# Patient Record
Sex: Female | Born: 1990 | Race: White | Hispanic: No | Marital: Married | Smoking: Never smoker
Health system: Southern US, Community
[De-identification: ages and names within clinical notes are randomized; demographics above are authoritative.]

## PROBLEM LIST (undated history)

## (undated) DIAGNOSIS — R87629 Unspecified abnormal cytological findings in specimens from vagina: Secondary | ICD-10-CM

## (undated) DIAGNOSIS — F909 Attention-deficit hyperactivity disorder, unspecified type: Secondary | ICD-10-CM

## (undated) DIAGNOSIS — D6851 Activated protein C resistance: Secondary | ICD-10-CM

## (undated) DIAGNOSIS — Z9889 Other specified postprocedural states: Secondary | ICD-10-CM

## (undated) DIAGNOSIS — F419 Anxiety disorder, unspecified: Secondary | ICD-10-CM

## (undated) DIAGNOSIS — M6530 Trigger finger, unspecified finger: Secondary | ICD-10-CM

## (undated) HISTORY — DX: Attention-deficit hyperactivity disorder, unspecified type: F90.9

## (undated) HISTORY — DX: Anxiety disorder, unspecified: F41.9

## (undated) HISTORY — DX: Trigger finger, unspecified finger: M65.30

## (undated) HISTORY — DX: Activated protein C resistance: D68.51

## (undated) HISTORY — DX: Unspecified abnormal cytological findings in specimens from vagina: R87.629

## (undated) HISTORY — PX: LEEP: SHX91

## (undated) HISTORY — DX: Other specified postprocedural states: Z98.890

---

## 2020-01-22 NOTE — L&D Delivery Note (Addendum)
       Delivery Note   Teresa Suarez is a 30 y.o. G3P2002 at [redacted]w[redacted]d Estimated Date of Delivery: 10/31/20  PRE-OPERATIVE DIAGNOSIS:  1) [redacted]w[redacted]d pregnancy.  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    2) Nuchal cord: Yes X1  SPECIMENS:   PLACENTA:   Appearance: Intact    Removal: Spontaneous      Disposition:    DISPOSITION:  Infant to left in stable condition in the delivery room, with L&D personnel and mother,  NARRATIVE SUMMARY: Labor course:  [name] is a [id number] at [redacted]w[redacted]d who presented for labor management.  She progressed well in labor without pitocin.  She received the appropriate anesthesia and proceeded to complete dilation.  AROM - clear fluid. 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 She evidenced good maternal expulsive effort during the second stage. She went on to deliver a viable infant. The placenta delivered without problems and was noted to be complete. A perineal and vaginal examination was performed. Episiotomy/Lacerations: None  Pt has refused lovenox - will accept compression stockings and SCDs and [id number]  Elonda Husky, M.D. 11/01/2020 4:58 PM

## 2020-04-11 LAB — OB RESULTS CONSOLE PLATELET COUNT: Platelets: 263

## 2020-04-11 LAB — OB RESULTS CONSOLE GC/CHLAMYDIA
Chlamydia: NEGATIVE
Gonorrhea: NEGATIVE

## 2020-04-11 LAB — OB RESULTS CONSOLE ABO/RH: RH Type: POSITIVE

## 2020-04-11 LAB — OB RESULTS CONSOLE HEPATITIS B SURFACE ANTIGEN
Hepatitis B Surface Ag: NEGATIVE
Hepatitis B Surface Ag: NEGATIVE

## 2020-04-11 LAB — OB RESULTS CONSOLE HGB/HCT, BLOOD
HCT: 43 — AB (ref 29–41)
Hemoglobin: 14.5

## 2020-04-11 LAB — OB RESULTS CONSOLE HIV ANTIBODY (ROUTINE TESTING): HIV: NONREACTIVE

## 2020-04-11 LAB — OB RESULTS CONSOLE ANTIBODY SCREEN: Antibody Screen: NEGATIVE

## 2020-08-08 ENCOUNTER — Other Ambulatory Visit

## 2020-08-08 ENCOUNTER — Other Ambulatory Visit: Payer: Self-pay

## 2020-08-08 ENCOUNTER — Ambulatory Visit (INDEPENDENT_AMBULATORY_CARE_PROVIDER_SITE_OTHER): Admitting: Obstetrics and Gynecology

## 2020-08-08 ENCOUNTER — Encounter: Payer: Self-pay | Admitting: Obstetrics and Gynecology

## 2020-08-08 VITALS — BP 124/79 | HR 80 | Ht 63.0 in | Wt 170.9 lb

## 2020-08-08 DIAGNOSIS — O99113 Other diseases of the blood and blood-forming organs and certain disorders involving the immune mechanism complicating pregnancy, third trimester: Secondary | ICD-10-CM | POA: Insufficient documentation

## 2020-08-08 DIAGNOSIS — Z3A28 28 weeks gestation of pregnancy: Secondary | ICD-10-CM

## 2020-08-08 DIAGNOSIS — Z3483 Encounter for supervision of other normal pregnancy, third trimester: Secondary | ICD-10-CM

## 2020-08-08 DIAGNOSIS — Z23 Encounter for immunization: Secondary | ICD-10-CM

## 2020-08-08 DIAGNOSIS — D6851 Activated protein C resistance: Secondary | ICD-10-CM

## 2020-08-08 DIAGNOSIS — O99213 Obesity complicating pregnancy, third trimester: Secondary | ICD-10-CM

## 2020-08-08 LAB — POCT URINALYSIS DIPSTICK OB
Bilirubin, UA: NEGATIVE
Blood, UA: NEGATIVE
Glucose, UA: NEGATIVE
Ketones, UA: NEGATIVE
Leukocytes, UA: NEGATIVE
Nitrite, UA: NEGATIVE
POC,PROTEIN,UA: NEGATIVE
Spec Grav, UA: 1.01 (ref 1.010–1.025)
Urobilinogen, UA: 0.2 E.U./dL
pH, UA: 7 (ref 5.0–8.0)

## 2020-08-08 MED ORDER — TETANUS-DIPHTH-ACELL PERTUSSIS 5-2.5-18.5 LF-MCG/0.5 IM SUSY
0.5000 mL | PREFILLED_SYRINGE | Freq: Once | INTRAMUSCULAR | Status: AC
  Administered 2020-08-08: 0.5 mL via INTRAMUSCULAR

## 2020-08-08 NOTE — Progress Notes (Signed)
NOB-Transfer-Pt stated that she was having hip pains maybe due to sleeping on an air mattress since waiting on house to be built. BTC, 1 hour glucose and tdpa completed. Hx of abnormal pap. Hx of LEEP and colpo. 1 episode of feeling like she was going to pass out. Pt husband needs a letter to be able to drive and be able to leave when needed.  Pt would like to breastfeed, epidural and husband planning to have vasectomy.

## 2020-08-08 NOTE — Progress Notes (Signed)
TRANSFER IN OB HISTORY AND PHYSICAL  SUBJECTIVE:       Teresa Suarez is a 30 y.o. G80P2002 female, Patient's last menstrual period was 01/25/2020., Estimated Date of Delivery: 10/31/20, [redacted]w[redacted]d, presents today for Transition of Prenatal Care.EPIC data migration from outside records is accomplished today. Husband is in the [employer], relocating from [place], [place].  Her pregnancy is significant for Factor V Leiden mutation (heterozygous).    Complaints today include:  Reports some hip pain. Thinks it is from sleeping on an air matress as they are in the process of moving.  Reports a near syncopal episode while grocery shopping ~ 2 weeks ago. Has never had this happen before. Was able to catch herself before actually passing out.     Gynecologic History Patient's last menstrual period was 01/25/2020. Normal Contraception: none Last Pap: ~ 1-2 years ago. Results were: normal. Remote h/o LEEP.    Obstetric History OB History  Gravida Para Term Preterm AB Living  3 2 2     2   SAB IAB Ectopic Multiple Live Births          2    # Outcome Date GA Lbr Len/2nd Weight Sex Delivery Anes PTL Lv  3 Current           2 Term 09/26/16    M Vag-Spont   LIV  1 Term 07/12/14    F Vag-Spont   LIV    Past Medical History:  Diagnosis Date   ADHD    Anxiety    Factor V Leiden (HCC)    Hetero-no vte hx expectant management; PP lovenox   History of colposcopy    Vaginal Pap smear, abnormal     Past Surgical History:  Procedure Laterality Date   LEEP N/A     Current Outpatient Medications on File Prior to Visit  Medication Sig Dispense Refill   Prenatal Vit-Fe Fumarate-FA (PRENATAL VITAMIN PO) Take by mouth.     No current facility-administered medications on file prior to visit.    Allergies  Allergen Reactions   Other Itching and Rash   Penicillins Rash    Rash as a child, itching as child     Social History   Socioeconomic History   Marital status: Married    Spouse name: Not on  file   Number of children: Not on file   Years of education: Not on file   Highest education level: Not on file  Occupational History   Not on file  Tobacco Use   Smoking status: Never   Smokeless tobacco: Never  Vaping Use   Vaping Use: Never used  Substance and Sexual Activity   Alcohol use: Never   Drug use: Never   Sexual activity: Yes  Other Topics Concern   Not on file  Social History Narrative   Not on file   Social Determinants of Health   Financial Resource Strain: Not on file  Food Insecurity: Not on file  Transportation Needs: Not on file  Physical Activity: Not on file  Stress: Not on file  Social Connections: Not on file  Intimate Partner Violence: Not on file    Family History  Problem Relation Age of Onset   Asthma Mother    Healthy Father    Factor V Leiden deficiency Paternal Grandmother     The following portions of the patient's history were reviewed and updated as appropriate: allergies, current medications, past OB history, past medical history, past surgical history, past family history, past social  history, and problem list.    OBJECTIVE: Initial Physical Exam (New OB) Blood pressure 124/79, pulse 80, height 5\' 3"  (1.6 m), weight 170 lb 14.4 oz (77.5 kg), last menstrual period 01/25/2020. Body mass index is 30.27 kg/m.   GENERAL APPEARANCE: alert, well appearing, mild obesity HEAD: normocephalic, atraumatic THYROID: no thyromegaly or masses present BREASTS: not examined LUNGS: clear to auscultation, no wheezes, rales or rhonchi, symmetric air entry HEART: regular rate and rhythm, no murmurs ABDOMEN: soft, nontender, nondistended, no abnormal masses, no epigastric pain.  FHT 140 bpm.  PELVIC EXAM deferred.  EXTREMITIES: no redness or tenderness in the calves or thighs SKIN: normal coloration and turgor, no rashes NEUROLOGIC: alert, oriented, normal speech, no focal findings or movement disorder noted    ASSESSMENT: Normal  pregnancy Factor V Leiden Near syncope Mild obesity of pregnancy  PLAN: Prenatal care - Discussed overview of the practice. Has had normal genetic testing. Discussed recommendations of use of Lovenox postpartum (heterozygosity is low risk of thrombophilia, so not currently on prophylaxis in pregnancy). Patient desires to think about this. Desires to breastfeed, considering vasectomy for contraception. Plans for an epidural in labor.  See orders - Completing 28 week labs today.  Tdap and blood consent signed.  Reviewed warning signs of syncope, advised to remain hydrated, rest when needed. Given reassurance that this can occur in pregnancy.  Patient desires letter for her husband to allow him to have a car on his campus due to her being pregnant.  Letter provided.  RTC in 2 weeks.    03/24/2020, MD [facility]

## 2020-08-08 NOTE — Patient Instructions (Addendum)
Common Medications Safe in Pregnancy  Acne:      Constipation:  Benzoyl Peroxide     Colace  Clindamycin      Dulcolax Suppository  Topica Erythromycin     Fibercon  Salicylic Acid      Metamucil         Miralax AVOID:        Senakot   Accutane    Cough:  Retin-A       Cough Drops  Tetracycline      Phenergan w/ Codeine if Rx  Minocycline      Robitussin (Plain & DM)  Antibiotics:     Crabs/Lice:  Ceclor       RID  Cephalosporins    AVOID:  E-Mycins      Kwell  Keflex  Macrobid/Macrodantin   Diarrhea:  Penicillin      Kao-Pectate  Zithromax      Imodium AD         PUSH FLUIDS AVOID:       Cipro     Fever:  Tetracycline      Tylenol (Regular or Extra  Minocycline       Strength)  Levaquin      Extra Strength-Do not          Exceed 8 tabs/24 hrs Caffeine:        <200mg/day (equiv. To 1 cup of coffee or  approx. 3 12 oz sodas)         Gas: Cold/Hayfever:       Gas-X  Benadryl      Mylicon  Claritin       Phazyme  **Claritin-D        Chlor-Trimeton    Headaches:  Dimetapp      ASA-Free Excedrin  Drixoral-Non-Drowsy     Cold Compress  Mucinex (Guaifenasin)     Tylenol (Regular or Extra  Sudafed/Sudafed-12 Hour     Strength)  **Sudafed PE Pseudoephedrine   Tylenol Cold & Sinus     Vicks Vapor Rub  Zyrtec  **AVOID if Problems With Blood Pressure         Heartburn: Avoid lying down for at least 1 hour after meals  Aciphex      Maalox     Rash:  Milk of Magnesia     Benadryl    Mylanta       1% Hydrocortisone Cream  Pepcid  Pepcid Complete   Sleep Aids:  Prevacid      Ambien   Prilosec       Benadryl  Rolaids       Chamomile Tea  Tums (Limit 4/day)     Unisom         Tylenol PM         Warm milk-add vanilla or  Hemorrhoids:       Sugar for taste  Anusol/Anusol H.C.  (RX: Analapram 2.5%)  Sugar Substitutes:  Hydrocortisone OTC     Ok in moderation  Preparation H      Tucks        Vaseline lotion applied to tissue with  wiping    Herpes:     Throat:  Acyclovir      Oragel  Famvir  Valtrex     Vaccines:         Flu Shot Leg Cramps:       *Gardasil  Benadryl      Hepatitis A         Hepatitis B Nasal Spray:         Pneumovax  Saline Nasal Spray     Polio Booster         Tetanus Nausea:       Tuberculosis test or PPD  Vitamin B6 25 mg TID   AVOID:    Dramamine      *Gardasil  Emetrol       Live Poliovirus  Ginger Root 250 mg QID    MMR (measles, mumps &  High Complex Carbs @ Bedtime    rebella)  Sea Bands-Accupressure    Varicella (Chickenpox)  Unisom 1/2 tab TID     *No known complications           If received before Pain:         Known pregnancy;   Darvocet       Resume series after  Lortab        Delivery  Percocet    Yeast:   Tramadol      Femstat  Tylenol 3      Gyne-lotrimin  Ultram       Monistat  Vicodin           MISC:         All Sunscreens           Hair Coloring/highlights          Insect Repellant's          (Including DEET)         Mystic Tans    Tdap (Tetanus, Diphtheria, Pertussis) Vaccine: What You Need to Know 1. Why get vaccinated? Tdap vaccine can prevent tetanus, diphtheria, and pertussis. Diphtheria and pertussis spread from person to person. Tetanus enters the body through cuts or wounds. TETANUS (T) causes painful stiffening of the muscles. Tetanus can lead to serious health problems, including being unable to open the mouth, having trouble swallowing and breathing, or death. DIPHTHERIA (D) can lead to difficulty breathing, heart failure, paralysis, or death. PERTUSSIS (aP), also known as "whooping cough," can cause uncontrollable, violent coughing that makes it hard to breathe, eat, or drink. Pertussis can be extremely serious especially in babies and young children, causing pneumonia, convulsions, brain damage, or death. In teens and adults, it can cause weight loss, loss of bladder control, passing out, and rib fractures from severe coughing. 2. Tdap vaccine Tdap  is only for children 7 years and older, adolescents, and adults.  Adolescents should receive a single dose of Tdap, preferably at age 25 or 33 years. Pregnant people should get a dose of Tdap during every pregnancy, preferably during the early part of the third trimester, to help protect the newborn from pertussis. Infants are most at risk for severe, life-threatening complications frompertussis. Adults who have never received Tdap should get a dose of Tdap. Also, adults should receive a booster dose of either Tdap or Td (a different vaccine that protects against tetanus and diphtheria but not pertussis) every 10 years, or after 5 years in the case of a severe or dirty wound or burn. Tdap may be given at the same time as other vaccines. 3. Talk with your health care provider Tell your vaccine provider if the person getting the vaccine: Has had an allergic reaction after a previous dose of any vaccine that protects against tetanus, diphtheria, or pertussis, or has any severe, life-threatening allergies Has had a coma, decreased level of consciousness, or prolonged seizures within 7 days after a previous dose of any pertussis vaccine (DTP, DTaP, or Tdap) Has seizures or another nervous system problem  Has ever had Guillain-Barr Syndrome (also called "GBS") Has had severe pain or swelling after a previous dose of any vaccine that protects against tetanus or diphtheria In some cases, your health care provider may decide to postpone Tdapvaccination until a future visit. People with minor illnesses, such as a cold, may be vaccinated. People who are moderately or severely ill should usually wait until they recover beforegetting Tdap vaccine.  Your health care provider can give you more information. 4. Risks of a vaccine reaction Pain, redness, or swelling where the shot was given, mild fever, headache, feeling tired, and nausea, vomiting, diarrhea, or stomachache sometimes happen after Tdap  vaccination. People sometimes faint after medical procedures, including vaccination. Tellyour provider if you feel dizzy or have vision changes or ringing in the ears.  As with any medicine, there is a very remote chance of a vaccine causing asevere allergic reaction, other serious injury, or death. 5. What if there is a serious problem? An allergic reaction could occur after the vaccinated person leaves the clinic. If you see signs of a severe allergic reaction (hives, swelling of the face and throat, difficulty breathing, a fast heartbeat, dizziness, or weakness), call 9-1-1and get the person to the nearest hospital. For other signs that concern you, call your health care provider.  Adverse reactions should be reported to the Vaccine Adverse Event Reporting System (VAERS). Your health care provider will usually file this report, or you can do it yourself. Visit the VAERS website at www.vaers.SamedayNews.es or call (951)632-1215. VAERS is only for reporting reactions, and VAERS staff members do not give medical advice. 6. The National Vaccine Injury Compensation Program The [employer] Vaccine Injury Compensation Program (VICP) is a federal program that was created to compensate people who may have been injured by certain vaccines. Claims regarding alleged injury or death due to vaccination have a time limit for filing, which may be as short as two years. Visit the VICP website at GoldCloset.com.ee or call 413-545-3256to learn about the program and about filing a claim. 7. How can I learn more? Ask your health care provider. Call your local or state health department. Visit the website of the Food and Drug Administration (FDA) for vaccine package inserts and additional information at TraderRating.uy. Contact the Centers for Disease Control and Prevention (CDC): Call (336) 784-9079 (1-800-CDC-INFO) or Visit CDC's website at http://hunter.com/. Vaccine  Information Statement Tdap (Tetanus, Diphtheria, Pertussis) Vaccine(08/27/2019) This information is not intended to replace advice given to you by your health care provider. Make sure you discuss any questions you have with your healthcare provider. Document Revised: 09/22/2019 Document Reviewed: 09/22/2019 Elsevier Patient Education  2022 [employer].

## 2020-08-09 ENCOUNTER — Telehealth: Payer: Self-pay | Admitting: Obstetrics and Gynecology

## 2020-08-09 LAB — CBC
Hematocrit: 39 % (ref 34.0–46.6)
Hemoglobin: 12.4 g/dL (ref 11.1–15.9)
MCH: 29 pg (ref 26.6–33.0)
MCHC: 31.8 g/dL (ref 31.5–35.7)
MCV: 91 fL (ref 79–97)
Platelets: 239 10*3/uL (ref 150–450)
RBC: 4.28 x10E6/uL (ref 3.77–5.28)
RDW: 13.2 % (ref 11.7–15.4)
WBC: 11.3 10*3/uL — ABNORMAL HIGH (ref 3.4–10.8)

## 2020-08-09 LAB — RPR: RPR Ser Ql: NONREACTIVE

## 2020-08-09 LAB — GLUCOSE, 1 HOUR GESTATIONAL: Gestational Diabetes Screen: 167 mg/dL — ABNORMAL HIGH (ref 65–139)

## 2020-08-09 NOTE — Telephone Encounter (Signed)
Pt called about scheduling glucose test, said that she had spoken with a nurse and was unsure if she needed 1 hour or 3 hour. I attempted to get [place] on phone- she was in a room. Pt seemed very upset, stating that there was poor communication, that she was not aware of glucose testing appointment. I had scheduled initial new ob apt- which was a transfer I told pt at the time of scheduling that the provider would direct her on glucose testing at her visit. Pt was scheduled for glucose testing pt was unaware. Pt is requesting to speak to office manager, I made her aware that she was currently on the phone but can definitely bring to her attention. I offered to go ahead and schedule glucose test- she did- she asked if she could keep same apt time and date if it got changed back to 1 hour. Please Advise. Front desk was unaware of any changes to this patient appointment, so we were unable to provide glucose instructions.

## 2020-08-10 NOTE — Telephone Encounter (Signed)
Pt states she is upset with the lack of communication from the office.   She called the office x 2 to ask about the glucose test- if she indeed was scheduled for it. Per the pt she states the front desk told her that they did not know if she was going to have a GTT  at her visit. She was only told of her appt with Dr. Valentino Saxon and it would be discussed at her visit. GTT appt was made after the nob as FH brought to my attention several day before her appt.  She did not know she was having the glucose test (even though when she self referred she told us it would be time for her GTT ) and did not have the instructions therefore she failed the test. She had a soda for dinner and protein only for breakfast. She also stated that she had to ask for the link to activate her my chart. Now she has to come back in the office for an additional  test.  Her husband has to take off work to watch her children.   She states she does not have GDM this is her 3rd baby, she is only 30 y/o and she is not a big person.   Thanked pt for bringing this to my attention. Aplogized to pt for the lack of communication and not meeting her expectations. Informed pt that her dinner and breakfast were appropriate for the GTT test. She did not have to be fasting. The GTT does not tell us if she has GDM the 3 hour test does.   Encouraged communication via my chart for easier response to nurse and provider for her clinical questions. Pt thanked me for the call.

## 2020-08-15 ENCOUNTER — Other Ambulatory Visit: Payer: Self-pay

## 2020-08-15 ENCOUNTER — Other Ambulatory Visit

## 2020-08-15 DIAGNOSIS — Z3A29 29 weeks gestation of pregnancy: Secondary | ICD-10-CM

## 2020-08-15 DIAGNOSIS — Z3483 Encounter for supervision of other normal pregnancy, third trimester: Secondary | ICD-10-CM

## 2020-08-16 LAB — GESTATIONAL GLUCOSE TOLERANCE
Glucose, Fasting: 76 mg/dL (ref 65–94)
Glucose, GTT - 1 Hour: 146 mg/dL (ref 65–179)
Glucose, GTT - 2 Hour: 140 mg/dL (ref 65–154)
Glucose, GTT - 3 Hour: 111 mg/dL (ref 65–139)

## 2020-08-22 ENCOUNTER — Encounter: Payer: Self-pay | Admitting: Obstetrics and Gynecology

## 2020-08-22 ENCOUNTER — Other Ambulatory Visit: Payer: Self-pay

## 2020-08-22 ENCOUNTER — Ambulatory Visit (INDEPENDENT_AMBULATORY_CARE_PROVIDER_SITE_OTHER): Admitting: Obstetrics and Gynecology

## 2020-08-22 ENCOUNTER — Other Ambulatory Visit: Payer: Self-pay | Admitting: Orthopedic Surgery

## 2020-08-22 VITALS — BP 131/85 | HR 99 | Wt 175.1 lb

## 2020-08-22 DIAGNOSIS — Z3483 Encounter for supervision of other normal pregnancy, third trimester: Secondary | ICD-10-CM

## 2020-08-22 DIAGNOSIS — Z3A30 30 weeks gestation of pregnancy: Secondary | ICD-10-CM

## 2020-08-22 DIAGNOSIS — M4802 Spinal stenosis, cervical region: Secondary | ICD-10-CM

## 2020-08-22 LAB — POCT URINALYSIS DIPSTICK OB
Bilirubin, UA: NEGATIVE
Glucose, UA: NEGATIVE
Leukocytes, UA: NEGATIVE
Nitrite, UA: NEGATIVE
POC,PROTEIN,UA: NEGATIVE
Spec Grav, UA: 1.01 (ref 1.010–1.025)
Urobilinogen, UA: 0.2 E.U./dL
pH, UA: 7 (ref 5.0–8.0)

## 2020-08-22 NOTE — Progress Notes (Signed)
ROB: No complaints.  Baby is active.  Taking vitamins.  Discussed 3-hour GTT (normal)

## 2020-09-02 ENCOUNTER — Ambulatory Visit

## 2020-09-06 ENCOUNTER — Encounter: Admitting: Obstetrics and Gynecology

## 2020-09-09 ENCOUNTER — Ambulatory Visit

## 2020-09-12 ENCOUNTER — Ambulatory Visit (INDEPENDENT_AMBULATORY_CARE_PROVIDER_SITE_OTHER): Admitting: Obstetrics and Gynecology

## 2020-09-12 ENCOUNTER — Other Ambulatory Visit: Payer: Self-pay

## 2020-09-12 ENCOUNTER — Encounter: Payer: Self-pay | Admitting: Obstetrics and Gynecology

## 2020-09-12 VITALS — BP 135/81 | HR 89 | Wt 178.9 lb

## 2020-09-12 DIAGNOSIS — Z3A33 33 weeks gestation of pregnancy: Secondary | ICD-10-CM

## 2020-09-12 DIAGNOSIS — Z3403 Encounter for supervision of normal first pregnancy, third trimester: Secondary | ICD-10-CM

## 2020-09-12 LAB — POCT URINALYSIS DIPSTICK OB
Bilirubin, UA: NEGATIVE
Blood, UA: NEGATIVE
Glucose, UA: NEGATIVE
Ketones, UA: NEGATIVE
Leukocytes, UA: NEGATIVE
Nitrite, UA: NEGATIVE
POC,PROTEIN,UA: NEGATIVE
Spec Grav, UA: 1.01 (ref 1.010–1.025)
Urobilinogen, UA: 0.2 E.U./dL
pH, UA: 7 (ref 5.0–8.0)

## 2020-09-12 NOTE — Progress Notes (Signed)
ROB: She is doing well, no new concerns. 

## 2020-09-12 NOTE — Progress Notes (Signed)
ROB: No complaints-doing well.  Just closed on her house and is moving in shortly.

## 2020-09-16 ENCOUNTER — Ambulatory Visit

## 2020-09-27 ENCOUNTER — Other Ambulatory Visit: Payer: Self-pay

## 2020-09-27 ENCOUNTER — Ambulatory Visit (INDEPENDENT_AMBULATORY_CARE_PROVIDER_SITE_OTHER): Admitting: Obstetrics and Gynecology

## 2020-09-27 ENCOUNTER — Encounter: Payer: Self-pay | Admitting: Obstetrics and Gynecology

## 2020-09-27 VITALS — BP 117/85 | HR 88 | Wt 179.2 lb

## 2020-09-27 DIAGNOSIS — Z3A35 35 weeks gestation of pregnancy: Secondary | ICD-10-CM

## 2020-09-27 DIAGNOSIS — Z3483 Encounter for supervision of other normal pregnancy, third trimester: Secondary | ICD-10-CM

## 2020-09-27 DIAGNOSIS — D6851 Activated protein C resistance: Secondary | ICD-10-CM

## 2020-09-27 DIAGNOSIS — O99113 Other diseases of the blood and blood-forming organs and certain disorders involving the immune mechanism complicating pregnancy, third trimester: Secondary | ICD-10-CM

## 2020-09-27 LAB — POCT URINALYSIS DIPSTICK OB
Bilirubin, UA: NEGATIVE
Blood, UA: NEGATIVE
Glucose, UA: NEGATIVE
Ketones, UA: NEGATIVE
Leukocytes, UA: NEGATIVE
POC,PROTEIN,UA: NEGATIVE
Spec Grav, UA: 1.01 (ref 1.010–1.025)
Urobilinogen, UA: 0.2 E.U./dL
pH, UA: 7.5 (ref 5.0–8.0)

## 2020-09-27 NOTE — Progress Notes (Signed)
ROB: Overall doing well.  Desires prescription for breast pump.  Has questions regarding circumcision (notes issues with last son having circumcision with not enough foreskin removed, desires to do same for current baby).  Questions answered.  RTC in 2 weeks. For 36 week cultures at that time.

## 2020-09-27 NOTE — Progress Notes (Signed)
ROB: She is doing well. She has no new concerns today. 

## 2020-09-27 NOTE — Patient Instructions (Signed)
Third Trimester of Pregnancy  The third trimester of pregnancy is from week 28 through week 40. This is also called months 7 through 9. This trimester is when your unborn baby (fetus) is growing very fast. At the end of the ninth month, the unborn baby is about20 inches long. It weighs about 6-10 pounds. Body changes during your third trimester Your body continues to go through many changes during this time. The changesvary and generally return to normal after the baby is born. Physical changes Your weight will continue to increase. You may gain 25-35 pounds (11-16 kg) by the end of the pregnancy. If you are underweight, you may gain 28-40 lb (about 13-18 kg). If you are overweight, you may gain 15-25 lb (about 7-11 kg). You may start to get stretch marks on your hips, belly (abdomen), and breasts. Your breasts will continue to grow and may hurt. A yellow fluid (colostrum) may leak from your breasts. This is the first milk you are making for your baby. You may have changes in your hair. Your belly button may stick out. You may have more swelling in your hands, face, or ankles. Health changes You may have heartburn. You may have trouble pooping (constipation). You may get hemorrhoids. These are swollen veins in the butt that can itch or get painful. You may have swollen veins (varicose veins) in your legs. You may have more body aches in the pelvis, back, or thighs. You may have more tingling or numbness in your hands, arms, and legs. The skin on your belly may also feel numb. You may feel short of breath as your womb (uterus) gets bigger. Other changes You may pee (urinate) more often. You may have more problems sleeping. You may notice the unborn baby "dropping," or moving lower in your belly. You may have more discharge coming from your vagina. Your joints may feel loose, and you may have pain around your pelvic bone. Follow these instructions at home: Medicines Take over-the-counter  and prescription medicines only as told by your doctor. Some medicines are not safe during pregnancy. Take a prenatal vitamin that contains at least 600 micrograms (mcg) of folic acid. Eating and drinking Eat healthy meals that include: Fresh fruits and vegetables. Whole grains. Good sources of protein, such as meat, eggs, or tofu. Low-fat dairy products. Avoid raw meat and unpasteurized juice, milk, and cheese. These carry germs that can harm you and your baby. Eat 4 or 5 small meals rather than 3 large meals a day. You may need to take these actions to prevent or treat trouble pooping: Drink enough fluids to keep your pee (urine) pale yellow. Eat foods that are high in fiber. These include beans, whole grains, and fresh fruits and vegetables. Limit foods that are high in fat and sugar. These include fried or sweet foods. Activity Exercise only as told by your doctor. Stop exercising if you start to have cramps in your womb. Avoid heavy lifting. Do not exercise if it is too hot or too humid, or if you are in a place of great height (high altitude). If you choose to, you may have sex unless your doctor tells you not to. Relieving pain and discomfort Take breaks often, and rest with your legs raised (elevated) if you have leg cramps or low back pain. Take warm water baths (sitz baths) to soothe pain or discomfort caused by hemorrhoids. Use hemorrhoid cream if your doctor approves. Wear a good support bra if your breasts are tender. If   you develop bulging, swollen veins in your legs: Wear support hose as told by your doctor. Raise your feet for 15 minutes, 3-4 times a day. Limit salt in your food. Safety Talk to your doctor before traveling far distances. Do not use hot tubs, steam rooms, or saunas. Wear your seat belt at all times when you are in a car. Talk with your doctor if someone is hurting you or yelling at you a lot. Preparing for your baby's arrival To prepare for the arrival  of your baby: Take prenatal classes. Visit the hospital and tour the maternity area. Buy a rear-facing car seat. Learn how to install it in your car. Prepare the baby's room. Take out all pillows and stuffed animals from the baby's crib. General instructions Avoid cat litter boxes and soil used by cats. These carry germs that can cause harm to the baby and can cause a loss of your baby by miscarriage or stillbirth. Do not douche or use tampons. Do not use scented sanitary pads. Do not smoke or use any products that contain nicotine or tobacco. If you need help quitting, ask your doctor. Do not drink alcohol. Do not use herbal medicines, illegal drugs, or medicines that were not approved by your doctor. Chemicals in these products can affect your baby. Keep all follow-up visits. This is important. Where to find more information American Pregnancy Association: americanpregnancy.org American College of Obstetricians and Gynecologists: www.acog.org Office on Women's Health: womenshealth.gov/pregnancy Contact a doctor if: You have a fever. You have mild cramps or pressure in your lower belly. You have a nagging pain in your belly area. You vomit, or you have watery poop (diarrhea). You have bad-smelling fluid coming from your vagina. You have pain when you pee, or your pee smells bad. You have a headache that does not go away when you take medicine. You have changes in how you see, or you see spots in front of your eyes. Get help right away if: Your water breaks. You have regular contractions that are less than 5 minutes apart. You are spotting or bleeding from your vagina. You have very bad belly cramps or pain. You have trouble breathing. You have chest pain. You faint. You have not felt the baby move for the amount of time told by your doctor. You have new or increased pain, swelling, or redness in an arm or leg. Summary The third trimester is from week 28 through week 40 (months 7  through 9). This is the time when your unborn baby is growing very fast. During this time, your discomfort may increase as you gain weight and as your baby grows. Get ready for your baby to arrive by taking prenatal classes, buying a rear-facing car seat, and preparing the baby's room. Get help right away if you are bleeding from your vagina, you have chest pain and trouble breathing, or you have not felt the baby move for the amount of time told by your doctor. This information is not intended to replace advice given to you by your health care provider. Make sure you discuss any questions you have with your healthcare provider. Document Revised: 06/16/2019 Document Reviewed: 04/22/2019 Elsevier Patient Education  2022 [employer].  

## 2020-09-28 ENCOUNTER — Ambulatory Visit
Admission: RE | Admit: 2020-09-28 | Discharge: 2020-09-28 | Disposition: A | Discharge status: Home / Self Care | Source: Ambulatory Visit | Attending: Orthopedic Surgery | Admitting: Orthopedic Surgery

## 2020-09-28 DIAGNOSIS — M4802 Spinal stenosis, cervical region: Secondary | ICD-10-CM | POA: Insufficient documentation

## 2020-09-28 IMAGING — MR MR CERVICAL SPINE W/O CM
5 series · 38 of 48 positions shown · non-contrast
Comparison: None.

CLINICAL DATA: Initial evaluation for right-sided neck pain and
tightness extending into the right upper extremity.

EXAM:
MRI CERVICAL SPINE WITHOUT CONTRAST
TECHNIQUE: Multiplanar, multisequence MR imaging of the cervical spine was
performed. No intravenous contrast was administered.

[Series 5: T2 · sagittal · 3.0mm · 0.62mm/px · 8 of 15 slices shown (1 of 2)]
[im 1/15]
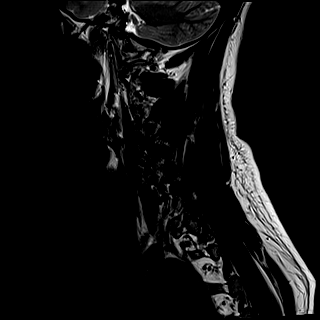
[im 3/15]
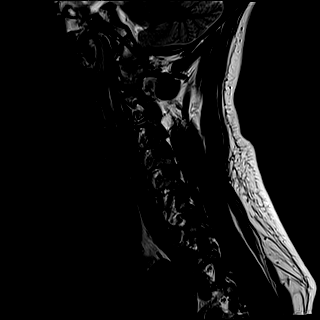
[im 5/15]
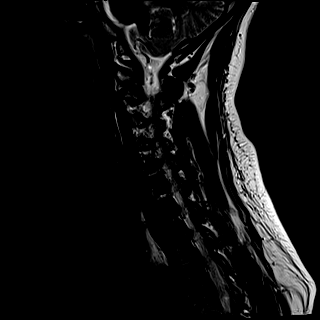
[im 7/15]
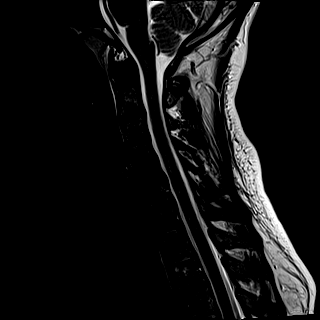
[im 9/15]
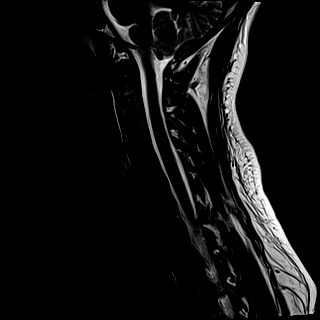
[im 11/15]
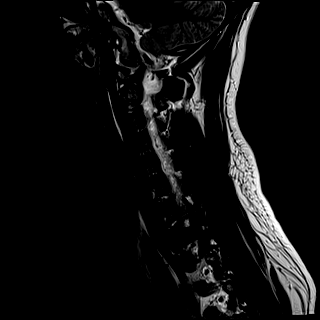
[im 13/15]
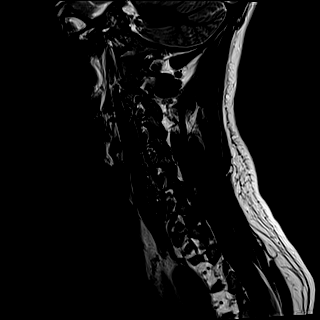
[im 15/15]
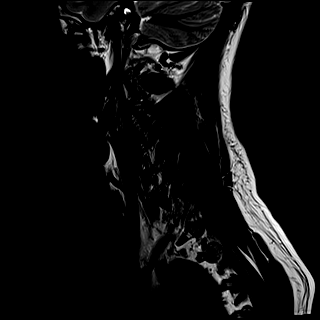

[Series 6: FLAIR · sagittal · 3.0mm · 0.78mm/px · 7 of 15 slices shown]
[im 1/15]
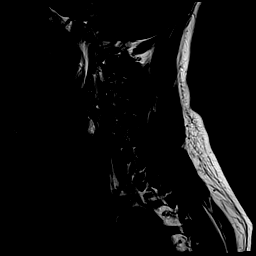
[im 3/15]
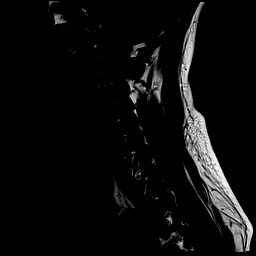
[im 5/15]
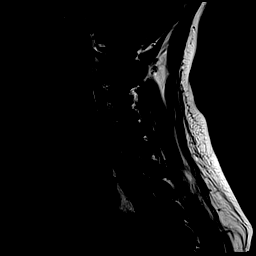
[im 8/15]
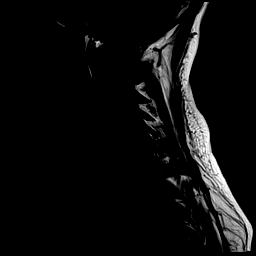
[im 10/15]
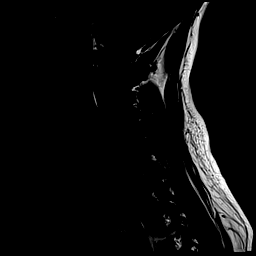
[im 12/15]
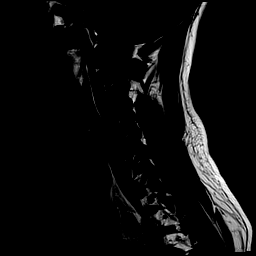
[im 15/15]
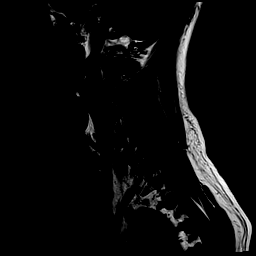

[Series 7: STIR · sagittal · 3.0mm · 0.62mm/px · 7 of 15 slices shown]
[im 1/15]
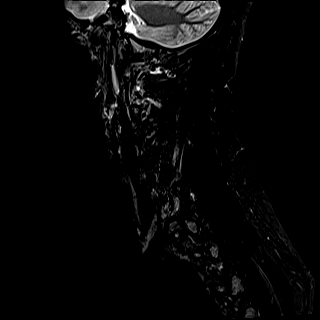
[im 3/15]
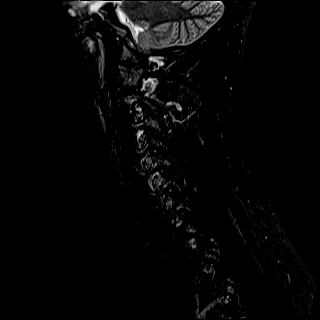
[im 5/15]
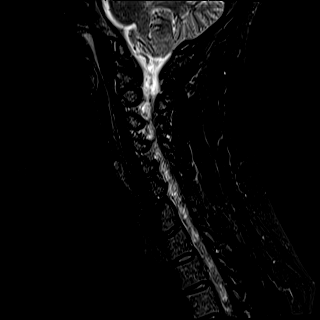
[im 8/15]
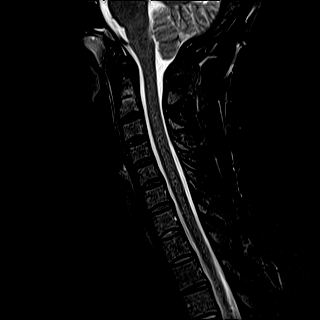
[im 10/15]
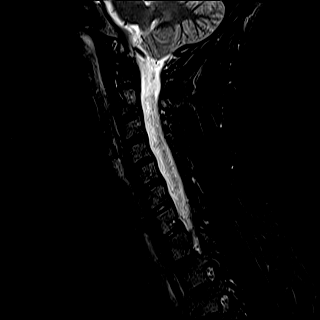
[im 12/15]
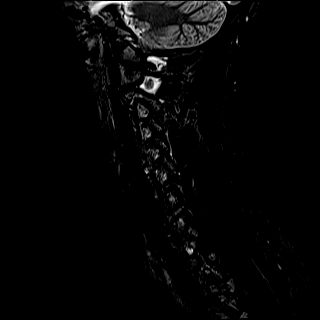
[im 15/15]
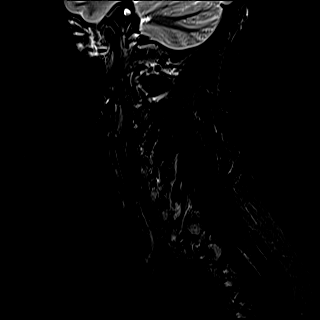

[Series 8: T2 · axial · 3.0mm · 0.70mm/px · z∈[-73,+16]mm · 9 of 27 slices shown (2 of 2)]
[im 1/27]
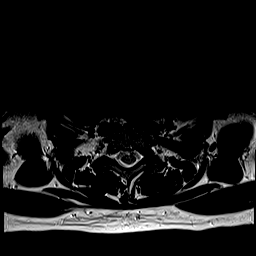
[im 5/27]
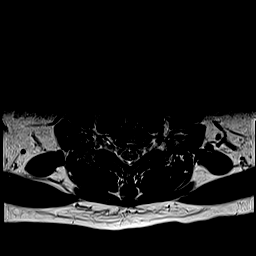
[im 9/27]
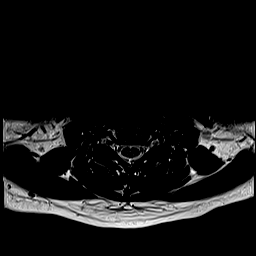
[im 11/27]
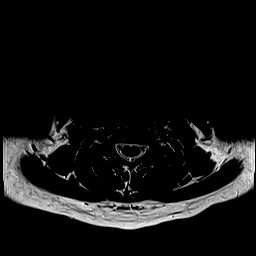
[im 14/27]
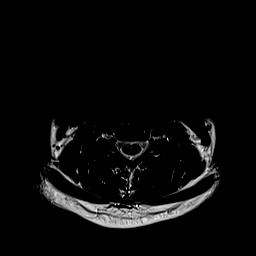
[im 16/27]
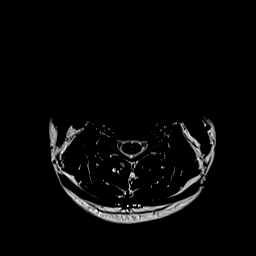
[im 18/27]
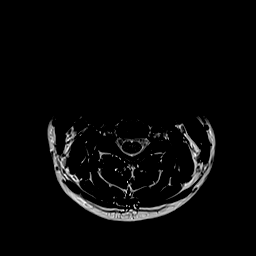
[im 22/27]
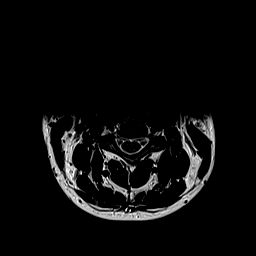
[im 27/27]
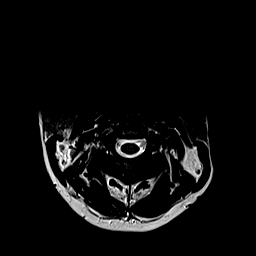

[Series 9: ax mpgr · axial · 3.0mm · 0.35mm/px · z∈[-73,-1]mm · 7 of 27 slices shown]
[im 1/27]
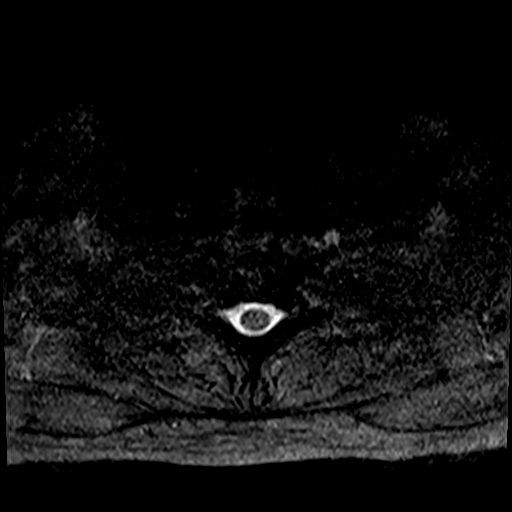
[im 5/27]
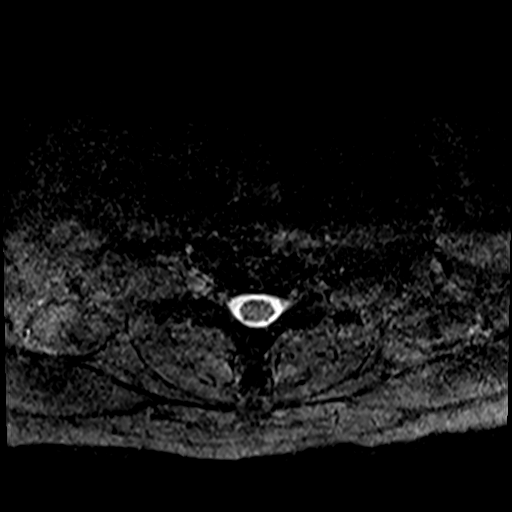
[im 9/27]
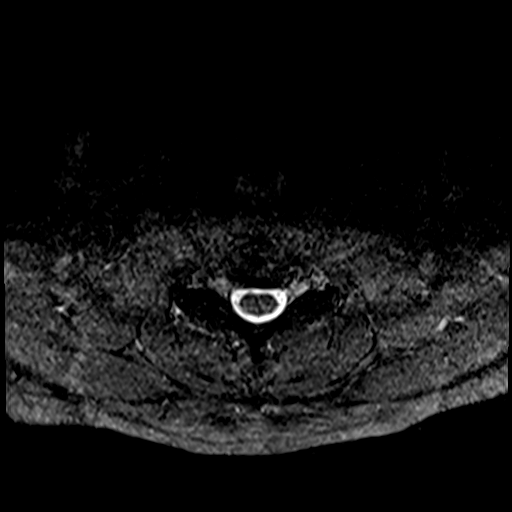
[im 11/27]
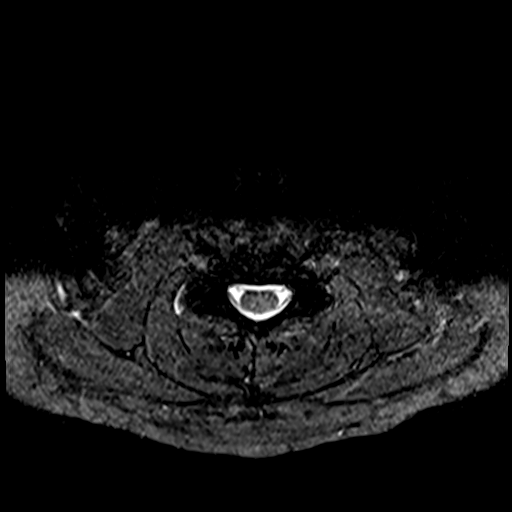
[im 16/27]
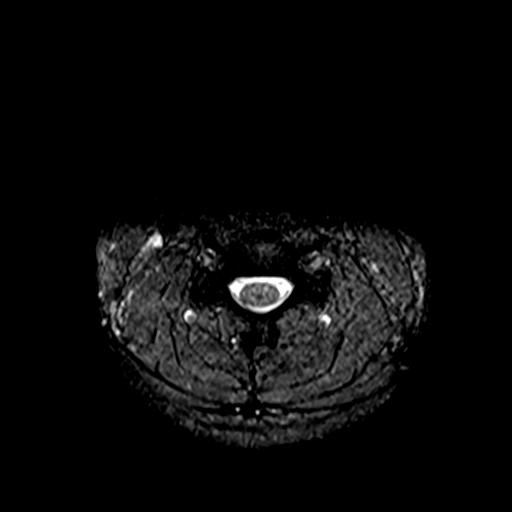
[im 18/27]
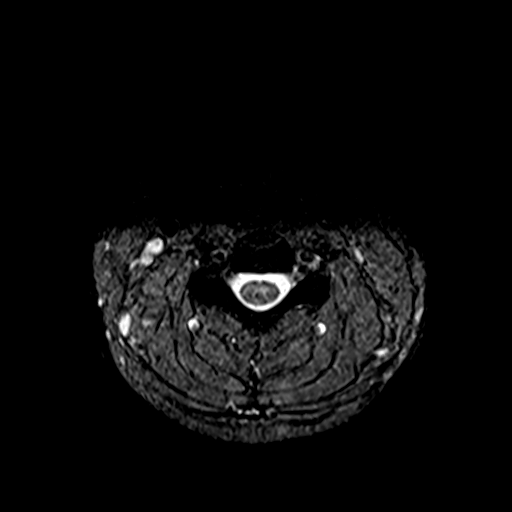
[im 22/27]
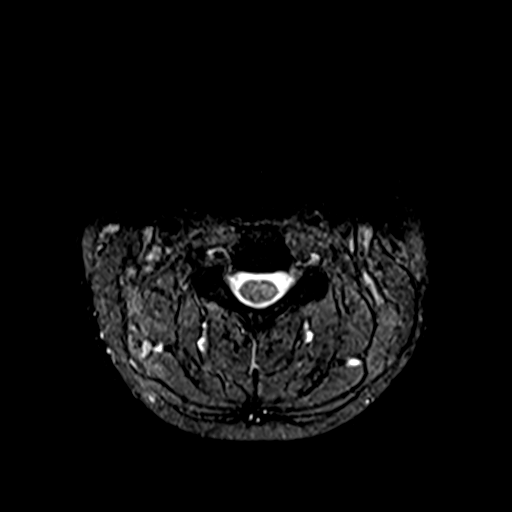

[38 of 48 positions shown; findings below may reference images not displayed]

FINDINGS: Alignment: Straightening of the normal cervical lordosis. No
listhesis.

Vertebrae: Vertebral body height maintained without acute or chronic
fracture. Bone marrow signal intensity within normal limits. Small
benign hemangioma noted within the T2 vertebral body. No worrisome
osseous lesions. No abnormal marrow edema.

Cord: Normal signal and morphology.

Posterior Fossa, vertebral arteries, paraspinal tissues: Mild Chiari
1 malformation with the cerebellar tonsils extending up to 6 mm
below the foramen magnum. Visualized brain and posterior fossa
otherwise unremarkable. Paraspinous and prevertebral soft tissues
normal. Normal flow voids seen within the vertebral arteries
bilaterally.

Disc levels:

C2-C3: Unremarkable.

C3-C4:  Unremarkable.

C4-C5:  Unremarkable.

C5-C6: Minimal annular disc bulge. No spinal stenosis. Foramina
remain patent.

C6-C7: Mild annular disc bulge. No spinal stenosis. Foramina remain
patent.

C7-T1:  Unremarkable.

Visualized upper thoracic spine demonstrates no significant finding.
IMPRESSION: 1. Minimal annular disc bulging at C5-6 and C6-7 without stenosis or
neural impingement.
2. Mild Chiari 1 malformation with the cerebellar tonsils extending
up to 6 mm below the foramen magnum.
3. Otherwise unremarkable MRI of the cervical spine.

## 2020-10-11 ENCOUNTER — Ambulatory Visit (INDEPENDENT_AMBULATORY_CARE_PROVIDER_SITE_OTHER): Admitting: Obstetrics and Gynecology

## 2020-10-11 ENCOUNTER — Other Ambulatory Visit: Payer: Self-pay

## 2020-10-11 ENCOUNTER — Encounter: Payer: Self-pay | Admitting: Obstetrics and Gynecology

## 2020-10-11 VITALS — BP 120/80 | HR 80 | Wt 180.8 lb

## 2020-10-11 DIAGNOSIS — I820 Budd-Chiari syndrome: Secondary | ICD-10-CM

## 2020-10-11 DIAGNOSIS — Z3403 Encounter for supervision of normal first pregnancy, third trimester: Secondary | ICD-10-CM

## 2020-10-11 DIAGNOSIS — Z3A37 37 weeks gestation of pregnancy: Secondary | ICD-10-CM

## 2020-10-11 DIAGNOSIS — Z3685 Encounter for antenatal screening for Streptococcus B: Secondary | ICD-10-CM

## 2020-10-11 HISTORY — DX: Budd-Chiari syndrome: I82.0

## 2020-10-11 LAB — POCT URINALYSIS DIPSTICK OB
Bilirubin, UA: NEGATIVE
Blood, UA: NEGATIVE
Glucose, UA: NEGATIVE
Ketones, UA: NEGATIVE
Leukocytes, UA: NEGATIVE
Nitrite, UA: NEGATIVE
Spec Grav, UA: 1.01 (ref 1.010–1.025)
Urobilinogen, UA: 0.2 E.U./dL
pH, UA: 6.5 (ref 5.0–8.0)

## 2020-10-11 NOTE — Patient Instructions (Signed)

## 2020-10-11 NOTE — Progress Notes (Signed)
ROB: She is doing well, no new concerns today. ?

## 2020-10-11 NOTE — Progress Notes (Signed)
ROB: Doing well. Notes occasional Deberah Pelton.  36 week cultures done today. 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 Has been advised to f/u with a Neurosurgeon postpartum.  Would recommend minimizing pushing (patient would like to labor down, has done so with previous pregnancies). Breast pump form completed. RTC in 1 week.

## 2020-10-13 LAB — STREP GP B NAA: Strep Gp B NAA: NEGATIVE

## 2020-10-15 LAB — GC/CHLAMYDIA PROBE AMP
Chlamydia trachomatis, NAA: NEGATIVE
Neisseria Gonorrhoeae by PCR: NEGATIVE

## 2020-10-17 ENCOUNTER — Encounter: Payer: Self-pay | Admitting: Obstetrics and Gynecology

## 2020-10-17 ENCOUNTER — Ambulatory Visit (INDEPENDENT_AMBULATORY_CARE_PROVIDER_SITE_OTHER): Admitting: Obstetrics and Gynecology

## 2020-10-17 ENCOUNTER — Other Ambulatory Visit: Payer: Self-pay

## 2020-10-17 VITALS — BP 114/80 | HR 75 | Wt 183.8 lb

## 2020-10-17 DIAGNOSIS — Z23 Encounter for immunization: Secondary | ICD-10-CM | POA: Diagnosis not present

## 2020-10-17 DIAGNOSIS — Z3A38 38 weeks gestation of pregnancy: Secondary | ICD-10-CM

## 2020-10-17 DIAGNOSIS — Z3403 Encounter for supervision of normal first pregnancy, third trimester: Secondary | ICD-10-CM

## 2020-10-17 NOTE — Progress Notes (Signed)
ROB: Having only rare contractions.  Budd-Chiari syndrome and laboring down discussed.  Patient continues to have some numbness in both hands especially at night.  Possible carpal tunnel?  Discussed use of neutral wrist splint.  Postdates induction discussed if necessary.  Patient would like to go as long as safely possible.  Postpartum neurosurgery referral.  Patient declined cervical exam.

## 2020-10-17 NOTE — Progress Notes (Signed)
ROB: She is doing well today, no new concerns. 

## 2020-10-26 ENCOUNTER — Encounter: Payer: Self-pay | Admitting: Obstetrics and Gynecology

## 2020-10-26 ENCOUNTER — Ambulatory Visit (INDEPENDENT_AMBULATORY_CARE_PROVIDER_SITE_OTHER): Admitting: Obstetrics and Gynecology

## 2020-10-26 ENCOUNTER — Other Ambulatory Visit: Payer: Self-pay

## 2020-10-26 VITALS — BP 124/83 | HR 77 | Wt 183.7 lb

## 2020-10-26 DIAGNOSIS — O99113 Other diseases of the blood and blood-forming organs and certain disorders involving the immune mechanism complicating pregnancy, third trimester: Secondary | ICD-10-CM

## 2020-10-26 DIAGNOSIS — Z3A39 39 weeks gestation of pregnancy: Secondary | ICD-10-CM

## 2020-10-26 DIAGNOSIS — Z3403 Encounter for supervision of normal first pregnancy, third trimester: Secondary | ICD-10-CM

## 2020-10-26 DIAGNOSIS — D6851 Activated protein C resistance: Secondary | ICD-10-CM

## 2020-10-26 LAB — POCT URINALYSIS DIPSTICK OB
Bilirubin, UA: NEGATIVE
Blood, UA: NEGATIVE
Glucose, UA: NEGATIVE
Ketones, UA: NEGATIVE
Leukocytes, UA: NEGATIVE
Nitrite, UA: NEGATIVE
POC,PROTEIN,UA: NEGATIVE
Spec Grav, UA: 1.01 (ref 1.010–1.025)
Urobilinogen, UA: 0.2 E.U./dL
pH, UA: 7 (ref 5.0–8.0)

## 2020-10-26 NOTE — Progress Notes (Signed)
She is doing well, no new concerns.

## 2020-10-26 NOTE — Progress Notes (Signed)
ROB: Doing well, no complaints.  Discussed option of IOL if no delivery by 40 weeks. Patient will consider, can schedule at next visit. Notes that she has thought more about Lovenox use after pregnancy for Factor V Leiden defect. Would prefer to stay active, use baby aspirin and compression stockings, declines Lovenox unless complications arise (I.e. requiring C-secton at birth, etc). RTC in 1 week. Labor precautions given.

## 2020-10-31 ENCOUNTER — Ambulatory Visit (INDEPENDENT_AMBULATORY_CARE_PROVIDER_SITE_OTHER): Admitting: Obstetrics and Gynecology

## 2020-10-31 ENCOUNTER — Other Ambulatory Visit: Payer: Self-pay

## 2020-10-31 VITALS — BP 136/85 | HR 74 | Wt 185.4 lb

## 2020-10-31 DIAGNOSIS — Z3A40 40 weeks gestation of pregnancy: Secondary | ICD-10-CM

## 2020-10-31 DIAGNOSIS — Z3483 Encounter for supervision of other normal pregnancy, third trimester: Secondary | ICD-10-CM

## 2020-10-31 LAB — POCT URINALYSIS DIPSTICK OB
Bilirubin, UA: NEGATIVE
Glucose, UA: NEGATIVE
Ketones, UA: NEGATIVE
Nitrite, UA: NEGATIVE
Spec Grav, UA: 1.015 (ref 1.010–1.025)
Urobilinogen, UA: 0.2 E.U./dL
pH, UA: 7 (ref 5.0–8.0)

## 2020-10-31 NOTE — Progress Notes (Signed)
ROB: Occasional irregular contractions but nothing strong.  Again discussed induction.  Have scheduled this for 10/18 at 11 AM -to be moved up in the day if possible.  COVID testing on 10/17.  BPP Thursday or Friday

## 2020-10-31 NOTE — Progress Notes (Signed)
ROB: She is doing well, no new concerns today. ?

## 2020-11-01 ENCOUNTER — Encounter: Payer: Self-pay | Admitting: Obstetrics and Gynecology

## 2020-11-01 ENCOUNTER — Inpatient Hospital Stay: Admitting: Anesthesiology

## 2020-11-01 ENCOUNTER — Other Ambulatory Visit: Payer: Self-pay

## 2020-11-01 ENCOUNTER — Inpatient Hospital Stay
Admission: EM | Admit: 2020-11-01 | Discharge: 2020-11-03 | DRG: 805 | Disposition: A | Discharge status: Home / Self Care | Attending: Obstetrics and Gynecology | Admitting: Obstetrics and Gynecology

## 2020-11-01 DIAGNOSIS — Z3A40 40 weeks gestation of pregnancy: Secondary | ICD-10-CM | POA: Diagnosis not present

## 2020-11-01 DIAGNOSIS — E669 Obesity, unspecified: Secondary | ICD-10-CM | POA: Diagnosis not present

## 2020-11-01 DIAGNOSIS — I820 Budd-Chiari syndrome: Secondary | ICD-10-CM | POA: Diagnosis present

## 2020-11-01 DIAGNOSIS — O99413 Diseases of the circulatory system complicating pregnancy, third trimester: Secondary | ICD-10-CM | POA: Diagnosis not present

## 2020-11-01 DIAGNOSIS — Z20822 Contact with and (suspected) exposure to covid-19: Secondary | ICD-10-CM | POA: Diagnosis present

## 2020-11-01 DIAGNOSIS — O99113 Other diseases of the blood and blood-forming organs and certain disorders involving the immune mechanism complicating pregnancy, third trimester: Secondary | ICD-10-CM | POA: Diagnosis not present

## 2020-11-01 DIAGNOSIS — D6851 Activated protein C resistance: Secondary | ICD-10-CM | POA: Diagnosis present

## 2020-11-01 DIAGNOSIS — O99214 Obesity complicating childbirth: Secondary | ICD-10-CM | POA: Diagnosis present

## 2020-11-01 DIAGNOSIS — O480 Post-term pregnancy: Secondary | ICD-10-CM | POA: Diagnosis not present

## 2020-11-01 DIAGNOSIS — O9912 Other diseases of the blood and blood-forming organs and certain disorders involving the immune mechanism complicating childbirth: Principal | ICD-10-CM | POA: Diagnosis present

## 2020-11-01 DIAGNOSIS — D682 Hereditary deficiency of other clotting factors: Secondary | ICD-10-CM

## 2020-11-01 DIAGNOSIS — O99213 Obesity complicating pregnancy, third trimester: Secondary | ICD-10-CM | POA: Diagnosis present

## 2020-11-01 DIAGNOSIS — O26893 Other specified pregnancy related conditions, third trimester: Secondary | ICD-10-CM | POA: Diagnosis present

## 2020-11-01 LAB — TYPE AND SCREEN
ABO/RH(D): B POS
Antibody Screen: NEGATIVE

## 2020-11-01 LAB — CBC
HCT: 40.3 % (ref 36.0–46.0)
Hemoglobin: 13.9 g/dL (ref 12.0–15.0)
MCH: 29.1 pg (ref 26.0–34.0)
MCHC: 34.5 g/dL (ref 30.0–36.0)
MCV: 84.3 fL (ref 80.0–100.0)
Platelets: 214 10*3/uL (ref 150–400)
RBC: 4.78 MIL/uL (ref 3.87–5.11)
RDW: 14.4 % (ref 11.5–15.5)
WBC: 11.3 10*3/uL — ABNORMAL HIGH (ref 4.0–10.5)
nRBC: 0 % (ref 0.0–0.2)

## 2020-11-01 LAB — RESP PANEL BY RT-PCR (FLU A&B, COVID) ARPGX2
Influenza A by PCR: NEGATIVE
Influenza B by PCR: NEGATIVE
SARS Coronavirus 2 by RT PCR: NEGATIVE

## 2020-11-01 LAB — ABO/RH: ABO/RH(D): B POS

## 2020-11-01 MED ORDER — SIMETHICONE 80 MG PO CHEW: 80.0000 mg | CHEWABLE_TABLET | ORAL | Status: DC | PRN

## 2020-11-01 MED ORDER — IBUPROFEN 600 MG PO TABS
600.0000 mg | ORAL_TABLET | Freq: Four times a day (QID) | ORAL | Status: DC
  Administered 2020-11-01 – 2020-11-03 (×7): 600 mg via ORAL
  Filled 2020-11-01 (×7): qty 1

## 2020-11-01 MED ORDER — PRENATAL MULTIVITAMIN CH
1.0000 | ORAL_TABLET | Freq: Every day | ORAL | Status: DC
  Administered 2020-11-02: 1 via ORAL
  Filled 2020-11-01: qty 1

## 2020-11-01 MED ORDER — ACETAMINOPHEN 325 MG PO TABS
650.0000 mg | ORAL_TABLET | ORAL | Status: DC | PRN
  Administered 2020-11-01 – 2020-11-02 (×5): 650 mg via ORAL
  Filled 2020-11-01 (×4): qty 2

## 2020-11-01 MED ORDER — FENTANYL-BUPIVACAINE-NACL 0.5-0.125-0.9 MG/250ML-% EP SOLN
EPIDURAL | Status: DC | PRN
  Administered 2020-11-01: 12 mL/h via EPIDURAL

## 2020-11-01 MED ORDER — OXYTOCIN BOLUS FROM INFUSION
333.0000 mL | Freq: Once | INTRAVENOUS | Status: AC
  Administered 2020-11-01: 333 mL via INTRAVENOUS

## 2020-11-01 MED ORDER — MISOPROSTOL 200 MCG PO TABS
ORAL_TABLET | ORAL | Status: AC
  Filled 2020-11-01: qty 4

## 2020-11-01 MED ORDER — ACETAMINOPHEN 325 MG PO TABS
650.0000 mg | ORAL_TABLET | ORAL | Status: DC | PRN
  Filled 2020-11-01: qty 2

## 2020-11-01 MED ORDER — LIDOCAINE HCL (PF) 1 % IJ SOLN
INTRAMUSCULAR | Status: DC | PRN
  Administered 2020-11-01: 3 mL via SUBCUTANEOUS

## 2020-11-01 MED ORDER — DIPHENHYDRAMINE HCL 25 MG PO CAPS: 25.0000 mg | ORAL_CAPSULE | Freq: Four times a day (QID) | ORAL | Status: DC | PRN

## 2020-11-01 MED ORDER — AMMONIA AROMATIC IN INHA
RESPIRATORY_TRACT | Status: AC
  Filled 2020-11-01: qty 10

## 2020-11-01 MED ORDER — OXYTOCIN 10 UNIT/ML IJ SOLN
INTRAMUSCULAR | Status: AC
  Filled 2020-11-01: qty 2

## 2020-11-01 MED ORDER — LIDOCAINE HCL (PF) 1 % IJ SOLN
30.0000 mL | INTRAMUSCULAR | Status: DC | PRN
  Filled 2020-11-01: qty 30

## 2020-11-01 MED ORDER — ONDANSETRON HCL 4 MG/2ML IJ SOLN: 4.0000 mg | Freq: Four times a day (QID) | INTRAMUSCULAR | Status: DC | PRN

## 2020-11-01 MED ORDER — LACTATED RINGERS IV SOLN
500.0000 mL | INTRAVENOUS | Status: DC | PRN
  Administered 2020-11-01: 500 mL via INTRAVENOUS
  Administered 2020-11-01: 1000 mL via INTRAVENOUS

## 2020-11-01 MED ORDER — LIDOCAINE-EPINEPHRINE (PF) 1.5 %-1:200000 IJ SOLN
INTRAMUSCULAR | Status: DC | PRN
  Administered 2020-11-01: 3 mL via EPIDURAL

## 2020-11-01 MED ORDER — LACTATED RINGERS IV SOLN: INTRAVENOUS | Status: DC

## 2020-11-01 MED ORDER — OXYTOCIN-SODIUM CHLORIDE 30-0.9 UT/500ML-% IV SOLN: 2.5000 [IU]/h | INTRAVENOUS | Status: DC | PRN

## 2020-11-01 MED ORDER — ASPIRIN EC 81 MG PO TBEC
81.0000 mg | DELAYED_RELEASE_TABLET | Freq: Every day | ORAL | Status: DC
  Administered 2020-11-01 – 2020-11-02 (×2): 81 mg via ORAL
  Filled 2020-11-01 (×4): qty 1

## 2020-11-01 MED ORDER — BENZOCAINE-MENTHOL 20-0.5 % EX AERO
1.0000 "application " | INHALATION_SPRAY | CUTANEOUS | Status: DC | PRN
  Administered 2020-11-01: 1 via TOPICAL
  Filled 2020-11-01 (×2): qty 56

## 2020-11-01 MED ORDER — OXYCODONE-ACETAMINOPHEN 5-325 MG PO TABS: 1.0000 | ORAL_TABLET | ORAL | Status: DC | PRN

## 2020-11-01 MED ORDER — FENTANYL-BUPIVACAINE-NACL 0.5-0.125-0.9 MG/250ML-% EP SOLN
EPIDURAL | Status: AC
  Filled 2020-11-01: qty 250

## 2020-11-01 MED ORDER — ZOLPIDEM TARTRATE 5 MG PO TABS: 5.0000 mg | ORAL_TABLET | Freq: Every evening | ORAL | Status: DC | PRN

## 2020-11-01 MED ORDER — BUPIVACAINE HCL (PF) 0.25 % IJ SOLN
INTRAMUSCULAR | Status: DC | PRN
  Administered 2020-11-01: 3 mL via EPIDURAL
  Administered 2020-11-01: 5 mL via EPIDURAL

## 2020-11-01 MED ORDER — OXYCODONE-ACETAMINOPHEN 5-325 MG PO TABS: 2.0000 | ORAL_TABLET | ORAL | Status: DC | PRN

## 2020-11-01 MED ORDER — TETANUS-DIPHTH-ACELL PERTUSSIS 5-2.5-18.5 LF-MCG/0.5 IM SUSY: 0.5000 mL | PREFILLED_SYRINGE | Freq: Once | INTRAMUSCULAR | Status: DC

## 2020-11-01 MED ORDER — OXYTOCIN-SODIUM CHLORIDE 30-0.9 UT/500ML-% IV SOLN
2.5000 [IU]/h | INTRAVENOUS | Status: DC
  Filled 2020-11-01: qty 500

## 2020-11-01 MED ORDER — SOD CITRATE-CITRIC ACID 500-334 MG/5ML PO SOLN: 30.0000 mL | ORAL | Status: DC | PRN

## 2020-11-01 MED ORDER — DOCUSATE SODIUM 100 MG PO CAPS
100.0000 mg | ORAL_CAPSULE | Freq: Two times a day (BID) | ORAL | Status: DC
  Administered 2020-11-02: 100 mg via ORAL
  Filled 2020-11-01 (×3): qty 1

## 2020-11-01 NOTE — Anesthesia Procedure Notes (Addendum)
Epidural Patient location during procedure: OB  Staffing Performed: anesthesiologist   Preanesthetic Checklist Completed: patient identified, IV checked, site marked, risks and benefits discussed, surgical consent, monitors and equipment checked, pre-op evaluation and timeout performed  Epidural Patient position: sitting Prep: Betadine Patient monitoring: heart rate, continuous pulse ox and blood pressure Approach: midline Location: L4-L5 Injection technique: LOR saline  Needle:  Needle type: Tuohy  Needle gauge: 18 G Needle length: 9 cm and 9 Needle insertion depth: 5 cm Catheter type: closed end flexible Catheter size: 20 Guage Catheter at skin depth: 11 cm Test dose: negative and 1.5% lidocaine with Epi 1:200 K  Assessment Sensory level: T10 Events: blood not aspirated, injection not painful, no injection resistance, no paresthesia and negative IV test  Additional Notes   Patient tolerated the insertion well without complications.-SATD -IVTD. No paresthesia. Refer to [facility] nursing for VS and dosingReason for block:procedure for pain

## 2020-11-01 NOTE — OB Triage Note (Signed)
Pt Teresa Suarez 30 y.o. presents to labor and delivery triage reporting contractions. Pt is a G3P2002 at [redacted]w[redacted]d . Pt denies signs and symptons consistent with rupture of membranes or active vaginal bleeding. Pt reports contractions since 0200 and rating themm 10/10. Pt states positive fetal movement. External FM and TOCO applied to non-tender abdomen and assessing. Initial FHR 110 . Vital signs obtained and within normal limits. Provider notified of pt.

## 2020-11-01 NOTE — H&P (Signed)
History and Physical   HPI  Teresa Suarez is a 30 y.o. G3P2002 at [redacted]w[redacted]d Estimated Date of Delivery: 10/31/20 who is being admitted for labor management.  Her medical complications include Budd-Chiari syndrome and factor V Leiden.   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   Heterozygous factor V Leiden affecting pregnancy in third trimester, antepartum (HCC) 08/08/2020   Obesity in pregnancy, antepartum, third trimester 08/08/2020    Prenatal labs and studies: ABO, Rh: B/Positive/-- (03/22 0000) Antibody: Negative (03/22 0000) Rubella:   RPR: Non Reactive (07/19 1119)  HBsAg: Negative, Negative (03/22 0000)  HIV: Non-reactive (03/22 0000)  02-25-1974-- (09/21 1344)   Past Medical History:  Diagnosis Date   ADHD    Anxiety    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         Allergies  Mushroom extract complex, Other, and Penicillins  Review of Systems  Pertinent items noted in HPI and remainder of comprehensive ROS otherwise negative.  Physical Exam  BP (!) 150/77 (BP  Location: Left Arm)   Pulse 79   Temp 97.7 F (36.5 C) (Oral)   Resp 18   Ht 5\' 3"  (1.6 m)   Wt 85 kg   LMP 01/25/2020   BMI 33.19 kg/m   Lungs:  CTA B Cardio: RRR without M/R/G Abd: Soft, gravid, NT Presentation: cephalic EXT: No C/C/ 1+ Edema DTRs: 2+ B CERVIX: Dilation: 5 Effacement (%): 80 Cervical Position: Middle Station: -3 Presentation: Vertex Exam by:: 03/24/2020  See Prenatal records for more detailed PE.   FHR:  Variability: Good {> 6 bpm)  Toco: Uterine Contractions: Q 5 min  Test Results  No results found for this or any previous visit (from the past 24 hour(s)). Group B Strep negative  Assessment   G3P2002 at [redacted]w[redacted]d Estimated Date of Delivery: 10/31/20  The fetus is reassuring.   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 Admit to L&D :   2. EFM: -- Category 1 3. Stadol or Epidural if desired.   4. Admission labs  5.  Expect vaginal birth 6.  Patient has declined Lovenox postpartum -we will plan for compression stockings, SCDs, and aspirin use as previously discussed with her.  Elonda Husky, M.D. 11/01/2020 1:19 PM

## 2020-11-01 NOTE — Anesthesia Preprocedure Evaluation (Addendum)
Anesthesia Evaluation  Patient identified by MRN, date of birth, ID band Patient awake    Reviewed: Allergy & Precautions, H&P , NPO status , Patient's Chart, lab work & pertinent test results, reviewed documented beta blocker date and time   Airway Mallampati: II  TM Distance: >3 FB Neck ROM: full    Dental no notable dental hx. (+) Teeth Intact   Pulmonary neg pulmonary ROS, Current Smoker,    Pulmonary exam normal breath sounds clear to auscultation       Cardiovascular Exercise Tolerance: Good negative cardio ROS   Rhythm:regular Rate:Normal     Neuro/Psych negative neurological ROS  negative psych ROS   GI/Hepatic negative GI ROS, Neg liver ROS,   Endo/Other  negative endocrine ROSdiabetes  Renal/GU      Musculoskeletal   Abdominal   Peds  Hematology negative hematology ROS (+)   Anesthesia Other Findings   Reproductive/Obstetrics (+) Pregnancy                             Anesthesia Physical Anesthesia Plan  ASA: 2  Anesthesia Plan: Epidural   Post-op Pain Management:    Induction:   PONV Risk Score and Plan:   Airway Management Planned:   Additional Equipment:   Intra-op Plan:   Post-operative Plan:   Informed Consent: I have reviewed the patients History and Physical, chart, labs and discussed the procedure including the risks, benefits and alternatives for the proposed anesthesia with the patient or authorized representative who has indicated his/her understanding and acceptance.       Plan Discussed with:   Anesthesia Plan Comments:         Anesthesia Quick Evaluation  

## 2020-11-02 ENCOUNTER — Other Ambulatory Visit

## 2020-11-02 LAB — RPR: RPR Ser Ql: NONREACTIVE

## 2020-11-02 NOTE — Anesthesia Postprocedure Evaluation (Signed)
Anesthesia Post Note  Patient: Teresa Suarez  Procedure(s) Performed: AN AD HOC LABOR EPIDURAL  Patient location during evaluation: Mother Baby Anesthesia Type: Spinal Level of consciousness: oriented and awake and alert Pain management: pain level controlled Vital Signs Assessment: post-procedure vital signs reviewed and stable Respiratory status: spontaneous breathing and respiratory function stable Cardiovascular status: blood pressure returned to baseline and stable Postop Assessment: no headache, no backache, no apparent nausea or vomiting and able to ambulate Anesthetic complications: no   No notable events documented.   Last Vitals:  Vitals:   11/02/20 0317 11/02/20 0802  BP: 139/87 (!) 126/97  Pulse: 78 83  Resp: 18 20  Temp: 36.7 C 36.8 C  SpO2: 100% 99%    Last Pain:  Vitals:   11/02/20 0815  TempSrc:   PainSc: 4                  Starling Manns

## 2020-11-02 NOTE — Progress Notes (Signed)
Progress Note - Vaginal Delivery  Teresa Suarez is a 30 y.o. G3P2002 now PP day 1 s/p vaginal delivery.  Subjective:  The patient reports no complaints, up ad lib, voiding, and tolerating PO Breastfeeding Pt desires stay until tomorrow  Objective:  Vital signs in last 24 hours: Temp:  [97.7 F (36.5 C)-98.7 F (37.1 C)] 98.1 F (36.7 C) (10/13 0317) Pulse Rate:  [59-96] 78 (10/13 0317) Resp:  [18] 18 (10/13 0317) BP: (121-153)/(68-118) 139/87 (10/13 0317) SpO2:  [96 %-100 %] 100 % (10/13 0317) Weight:  [85 kg] 85 kg (10/12 1100)  Physical Exam:  General: alert, cooperative, and no distress Lochia: appropriate Uterine Fundus: firm    Data Review Recent Labs    11/01/20 1255  HGB 13.9  HCT 40.3    Assessment/Plan: Active Problems:   Indication for care in labor and delivery, antepartum   Plan for discharge tomorrow Cotninue ASA, and SCDs  -- Continue routine PP care.     Elonda Husky, M.D. 11/02/2020 7:33 AM

## 2020-11-02 NOTE — Lactation Note (Signed)
This note was copied from a baby's chart. Lactation Consultation Note  Patient Name: Teresa Suarez [id number] Date: 11/02/2020 Reason for consult: Initial assessment;Term Age:28 hours  Initial lactation visit. Mom is P3, SVD 17hrs ago. Mom has extensive BF history- 63months with daughter (now 56yrs old), and 73 months with son (now 23yrs old). Mom does report that her son did have a hx of tip tie that was revised at 29 months old. 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   Reviewed BF basics: newborn stomach size, feeding patterns and behaviors, 8-12 attempts in first 24hrs and then 8 or more feedings per 24hrs after day 1. Reviewed output expectations and other signs that baby is getting enough.  Mom had no questions/concerns at this time. Whiteboard updated with [facility] name/contact number, encouraged to call out as needed.   Maternal Data Has patient been taught Hand Expression?: Yes Does the patient have breastfeeding experience prior to this delivery?: Yes How long did the patient breastfeed?: 52months, 13 months  Feeding Mother's Current Feeding Choice: Breast Milk  LATCH Score                    Lactation Tools Discussed/Used    Interventions Interventions: Breast feeding basics reviewed;Hand express;Education  Discharge    Consult Status Consult Status: Follow-up Date: 11/02/20 Follow-up type: Call as needed    Danford Bad 11/02/2020, 10:11 AM

## 2020-11-03 MED ORDER — IBUPROFEN 600 MG PO TABS: 600.0000 mg | ORAL_TABLET | Freq: Four times a day (QID) | ORAL | 1 refills | Status: DC

## 2020-11-03 NOTE — Progress Notes (Addendum)
Post Partum Day # 2, s/p SVD  Subjective: no complaints, up ad lib, voiding, and tolerating PO  Objective: Vitals:   11/02/20 0802 11/02/20 1138 11/02/20 1606 11/02/20 2238  BP: (!) 126/97 (!) 130/92 130/87 139/89  Pulse: 83 70 68 74  Resp: 20  18 18   Temp: 98.2 F (36.8 C)  99 F (37.2 C) 98.4 F (36.9 C)  TempSrc: Oral  Oral Oral  SpO2: 99%   99%  Weight:      Height:         Physical Exam:  General: alert and no distress  Lungs: clear to auscultation bilaterally Breasts: normal appearance, no masses or tenderness Heart: regular rate and rhythm, S1, S2 normal, no murmur, click, rub or gallop Abdomen: soft, non-tender; bowel sounds normal; no masses,  no organomegaly Pelvis: Lochia: appropriate, Uterine Fundus: firm Extremities: DVT Evaluation: No evidence of DVT seen on physical exam. Negative Homan's sign. No cords or calf tenderness. No significant calf/ankle edema. Wearing SCDs.  Recent Labs    11/01/20 1255  HGB 13.9  HCT 40.3    Assessment/Plan: - Doing well postpartum - Breastfeeding, doing well - Circumcision completed.  - Contraception vasectomy - History of Factor V Leiden (heterozygous). Has declined use of anticoagulant postpartum to decrease risk of thromboembolic event. Notes that she will use compression stockings at home.  - Labile BPs noted.  Will have patient follow up in office for BP check next week. No medications initiated at this time.  - Discharge home today   LOS: 2 days   01/01/21, MD [facility] 11/03/2020 7:14 AM

## 2020-11-03 NOTE — Progress Notes (Signed)
Patient d/c home with infant. D/c instructions, Rx, and f/u appt given to and reviewed with pt. Pt verbalized understanding. Escorted out by auxillary.   

## 2020-11-03 NOTE — Discharge Summary (Signed)
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 yo [id number] admitted in Active Labor on 11/01/2020. Patient had an uncomplicated labor course as follows:  Membrane Rupture Time/Date: 4:16 PM ,11/01/2020   Delivery Method:  Episiotomy: None  Lacerations:  None  Patient had an uncomplicated postpartum course.  She is ambulating, tolerating a regular diet, passing flatus, and urinating well. Patient is discharged home in stable condition on 11/03/20.  Newborn Data: Birth date:11/01/2020  Birth time:4:43 PM  Gender:Female  Living status:Living  Apgars:9 ,10  Weight:3600 g   Magnesium Sulfate received: No BMZ received: No Rhophylac:No MMR:No T-DaP:Given prenatally Flu: Given prenatally Transfusion:No  Physical exam  Vitals:   11/02/20 1138 11/02/20 1606 11/02/20 2238 11/03/20 0737  BP: (!) 130/92 130/87 139/89 133/89  Pulse: 70 68 74 69  Resp:  _0 Temp:  99 F (37.2 C) 98.4 F (36.9 C) 98 F (36.7 C)  TempSrc:  Oral Oral Oral  SpO2:   99% 100%  Weight:      Height:       General:  alert, cooperative, and no distress Lochia: appropriate Uterine Fundus: firm Incision: N/A DVT Evaluation: No evidence of DVT seen on physical exam. Negative Homan's sign. No cords or calf tenderness. No significant calf/ankle edema.   Labs: Lab Results  Component Value Date   WBC 11.3 (H) 11/01/2020   HGB 13.9 11/01/2020   HCT 40.3 11/01/2020   MCV 84.3 11/01/2020   PLT 214 11/01/2020    Edinburgh Score: Edinburgh Postnatal Depression Scale Screening Tool 11/03/2020  I have been able to laugh and see the funny side of things. 0  I have looked forward with enjoyment to things. 0  I have blamed myself unnecessarily when things went wrong. 2  I have been anxious or worried for no good reason. 0  I have felt scared or panicky for no good reason. 0  Things have been getting on top of me. 0  I have been so unhappy that I have had difficulty sleeping. 0  I have felt sad or miserable. 0  I have been so unhappy that I have been crying. 0  The thought of harming myself has occurred to me. 0  Edinburgh Postnatal Depression Scale Total 2      After visit meds:  Allergies as of 11/03/2020       Reactions   Mushroom Extract Complex Itching, Rash   Other  Itching, Rash   Penicillins Rash   Rash as a child, itching as child         Medication List     TAKE these medications    ibuprofen 600 MG tablet Commonly known as: ADVIL Take 1 tablet (600 mg total) by mouth every 6 (six) hours.   PRENATAL VITAMIN PO Take by mouth.         Discharge home in stable condition Infant Feeding: Breast Infant Disposition:home with mother Discharge instruction: per After Visit Summary and Postpartum booklet. Activity: Advance as tolerated. Pelvic rest for 6 weeks.  Diet: routine diet Anticipated Birth Control:  Vasectomy Postpartum Appointment:6 weeks Additional Postpartum F/U: BP check 1 week Future Appointments:No future appointments. Follow up Visit:  Follow-up Information      Harlin Heys, MD Follow up.   Specialties: Obstetrics and Gynecology, Radiology Why: 1 week BP check and postpartum mood check 6 week postpartum visit Contact information: [address] [place] [address] [phone]                     11/03/2020 Rubie Maid, MD

## 2020-11-10 ENCOUNTER — Encounter: Payer: Self-pay | Admitting: Obstetrics and Gynecology

## 2020-11-10 ENCOUNTER — Ambulatory Visit (INDEPENDENT_AMBULATORY_CARE_PROVIDER_SITE_OTHER): Admitting: Obstetrics and Gynecology

## 2020-11-10 ENCOUNTER — Other Ambulatory Visit: Payer: Self-pay

## 2020-11-10 VITALS — BP 139/85 | HR 93 | Ht 63.0 in | Wt 168.8 lb

## 2020-11-10 DIAGNOSIS — O139 Gestational [pregnancy-induced] hypertension without significant proteinuria, unspecified trimester: Secondary | ICD-10-CM

## 2020-11-10 MED ORDER — LABETALOL HCL 100 MG PO TABS: 100.0000 mg | ORAL_TABLET | Freq: Two times a day (BID) | ORAL | 0 refills | Status: DC

## 2020-11-10 NOTE — Progress Notes (Signed)
HPI:      [name] is a 30 y.o. G3P2002 who LMP was No LMP recorded.  Subjective:   She presents today approximately 1 week postpartum.  She reports she is doing very well.  Breast-feeding full-time.  Getting some rest.  She has no complaints.  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 She does not have any pertinent problems on file. She  has a past surgical history that includes LEEP (N/A). Her family history includes Asthma in her mother; Factor V Leiden deficiency in her paternal grandmother; Healthy in her father. She  reports that she has never smoked. She has never used smokeless tobacco. She reports that she does not drink alcohol and does not use drugs. She has a current medication list which includes the following prescription(s): ibuprofen, labetalol, and prenatal vit-fe fumarate-fa. She is allergic to mushroom extract complex, other, and penicillins.       Review of Systems:  Review of Systems  Constitutional: Denied constitutional symptoms, night sweats, recent illness, fatigue, fever, insomnia and weight loss.  Eyes: Denied eye symptoms, eye pain, photophobia, vision change and visual disturbance.  Ears/Nose/Throat/Neck: Denied ear, nose, throat or neck symptoms, hearing loss, nasal discharge, sinus congestion and sore throat.  Cardiovascular: Denied cardiovascular symptoms, arrhythmia, chest pain/pressure, edema, exercise intolerance, orthopnea and palpitations.  Respiratory: Denied pulmonary symptoms, asthma, pleuritic pain, productive sputum, cough, dyspnea and wheezing.  Gastrointestinal: Denied, gastro-esophageal reflux, melena, nausea and vomiting.  Genitourinary: Denied genitourinary  symptoms including symptomatic vaginal discharge, pelvic relaxation issues, and urinary complaints.  Musculoskeletal: Denied musculoskeletal symptoms, stiffness, swelling, muscle weakness and myalgia.  Dermatologic: Denied dermatology symptoms, rash and scar.  Neurologic: Denied neurology symptoms, dizziness, headache, neck pain and syncope.  Psychiatric: Denied psychiatric symptoms, anxiety and depression.  Endocrine: Denied endocrine symptoms including hot flashes and night sweats.   Meds:   Current Outpatient Medications on File Prior to Visit  Medication Sig Dispense Refill   ibuprofen (ADVIL) 600 MG tablet Take 1 tablet (600 mg total) by mouth every 6 (six) hours. 60 tablet 1   Prenatal Vit-Fe Fumarate-FA (PRENATAL VITAMIN PO) Take by mouth.     No current facility-administered medications on file prior to visit.      Objective:     Vitals:   11/10/20 1140  BP: 139/85  Pulse: 93   Filed Weights   11/10/20 1140  Weight: 168 lb 12.8 oz (76.6 kg)              Patient with some continued hypertension issues.          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 Gestational hypertension, antepartum     Although her blood pressure declined while in the office it still remained somewhat high and her first reading was significant.   Plan:            1.  Patient doing well postpartum  2.  Hypertension-will begin labetalol 100 twice daily.  Consider weaning at 6-week checkup. Orders No orders of the defined types were placed in this encounter.    Meds ordered this encounter  Medications   labetalol (NORMODYNE) 100 MG tablet    Sig: Take 1 tablet (100 mg total)  by mouth 2 (two) times daily.    Dispense:  80 tablet    Refill:  0      F/U  No follow-ups on file.  Elonda Husky, M.D. 11/10/2020 12:02 PM

## 2020-11-10 NOTE — Progress Notes (Signed)
Postpartum pt present for bp and postpartum mood check.  Pt stated that she was doing well.  EPDS=2.

## 2020-11-28 ENCOUNTER — Telehealth: Payer: Self-pay | Admitting: Obstetrics and Gynecology

## 2020-11-28 NOTE — Telephone Encounter (Signed)
Pt called to reschedule her PPV - now scheduled for 12-19-2020, pt stsates that she was RX blood pressure medication and will run out before her apt. She wants to know if she is to continue - will need refill; or if she needs to stop medication. Please Advise.

## 2020-11-29 ENCOUNTER — Other Ambulatory Visit: Payer: Self-pay

## 2020-11-29 DIAGNOSIS — O139 Gestational [pregnancy-induced] hypertension without significant proteinuria, unspecified trimester: Secondary | ICD-10-CM

## 2020-11-29 MED ORDER — LABETALOL HCL 100 MG PO TABS
100.0000 mg | ORAL_TABLET | Freq: Two times a day (BID) | ORAL | 0 refills | Status: DC
Start: 2020-11-29 — End: 2022-04-23

## 2020-12-05 NOTE — Telephone Encounter (Signed)
Patient called.  Patient aware.  

## 2020-12-06 ENCOUNTER — Other Ambulatory Visit: Payer: Self-pay | Admitting: Neurosurgery

## 2020-12-06 DIAGNOSIS — R202 Paresthesia of skin: Secondary | ICD-10-CM

## 2020-12-06 DIAGNOSIS — G935 Compression of brain: Secondary | ICD-10-CM

## 2020-12-12 ENCOUNTER — Encounter: Admitting: Obstetrics and Gynecology

## 2020-12-19 ENCOUNTER — Other Ambulatory Visit: Payer: Self-pay

## 2020-12-19 ENCOUNTER — Encounter: Payer: Self-pay | Admitting: Obstetrics and Gynecology

## 2020-12-19 ENCOUNTER — Ambulatory Visit (INDEPENDENT_AMBULATORY_CARE_PROVIDER_SITE_OTHER): Admitting: Obstetrics and Gynecology

## 2020-12-19 NOTE — Progress Notes (Signed)
HPI:      [name] is a 30 y.o. G3P2002 who LMP was No LMP recorded.  Subjective:   She presents today approximately 6 weeks postpartum.  She reports that she is not having any significant issues.  She is breast-feeding full-time.  She has weaned down to 100 mg labetalol daily. She and her partner are planning a vasectomy for birth control and she states she does not need any birth control at this time.    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 She does not have any pertinent problems on file. She  has a past surgical history that includes LEEP (N/A). Her family history includes Asthma in her mother; Factor V Leiden deficiency in her paternal grandmother; Healthy in her father. She  reports that she has never smoked. She has never used smokeless tobacco. She reports that she does not drink alcohol and does not use drugs. She has a current medication list which includes the following prescription(s): ibuprofen, labetalol, and prenatal vit-fe fumarate-fa. She is allergic to mushroom extract complex, other, and penicillins.       Review of Systems:  Review of Systems  Constitutional: Denied constitutional symptoms, night sweats, recent illness, fatigue, fever, insomnia and weight loss.  Eyes: Denied eye symptoms, eye pain, photophobia, vision change and visual disturbance.  Ears/Nose/Throat/Neck: Denied ear, nose, throat or neck symptoms, hearing loss, nasal discharge, sinus congestion and sore throat.  Cardiovascular: Denied cardiovascular symptoms, arrhythmia, chest pain/pressure, edema, exercise intolerance, orthopnea and palpitations.  Respiratory: Denied pulmonary symptoms, asthma, pleuritic pain, productive sputum, cough, dyspnea and  wheezing.  Gastrointestinal: Denied, gastro-esophageal reflux, melena, nausea and vomiting.  Genitourinary: Denied genitourinary symptoms including symptomatic vaginal discharge, pelvic relaxation issues, and urinary complaints.  Musculoskeletal: Denied musculoskeletal symptoms, stiffness, swelling, muscle weakness and myalgia.  Dermatologic: Denied dermatology symptoms, rash and scar.  Neurologic: Denied neurology symptoms, dizziness, headache, neck pain and syncope.  Psychiatric: Denied psychiatric symptoms, anxiety and depression.  Endocrine: Denied endocrine symptoms including hot flashes and night sweats.   Meds:   Current Outpatient Medications on File Prior to Visit  Medication Sig Dispense Refill   ibuprofen (ADVIL) 600 MG tablet Take 1 tablet (600 mg total) by mouth every 6 (six) hours. 60 tablet 1   labetalol (NORMODYNE) 100 MG tablet Take 1 tablet (100 mg total) by mouth 2 (two) times daily. 80 tablet 0   Prenatal Vit-Fe Fumarate-FA (PRENATAL VITAMIN PO) Take by mouth.     No current facility-administered medications on file prior to visit.      Objective:     Vitals:   12/19/20 1037  BP: 129/84  Pulse: 94   Filed Weights   12/19/20 1037  Weight: 166 lb (75.3 kg)              Physical examination   Pelvic:   Vulva: Normal appearance.  No lesions.  Vagina: No lesions or abnormalities noted.  Support: Normal pelvic support.  Urethra No masses tenderness or scarring.  Meatus Normal size without lesions or prolapse.  Cervix: Normal appearance.  No lesions.  Anus: Normal exam.  No lesions.  Perineum: Normal exam.  No lesions.        Bimanual   Uterus: Normal  size.  Non-tender.  Mobile.  AV.  Adnexae: No masses.  Non-tender to palpation.  Cul-de-sac: Negative for abnormality.             Assessment:    G3P2002 Patient Active Problem List   Diagnosis Date Noted   Indication for care in labor and delivery, antepartum 11/01/2020   Budd-Chiari syndrome  ([place]) 10/11/2020   Heterozygous factor V Leiden affecting pregnancy in third trimester, antepartum ([place]) 08/08/2020   Obesity in pregnancy, antepartum, third trimester 08/08/2020     1. Postpartum care and examination immediately after delivery     Patient doing well postpartum.  Currently weaning off labetalol.  Has stopped taking aspirin for factor V Leiden and Budd-Chiari   Plan:            1.  Patient may resume normal activities.  2.  She will inform [place] if she changes her mind regarding birth control.  3.  She will begin self-monitoring of blood pressures.  She plans to inform us if these are elevated and we will consider more long-term medications as necessary.  4.  She has a remote history of LEEP and is due for a follow-up Pap smear with her annual exam. Orders No orders of the defined types were placed in this encounter.   No orders of the defined types were placed in this encounter.     F/U  Return in about 3 months (around 03/20/2021) for Annual Physical.  Finis Bud, M.D. 12/19/2020 10:59 AM

## 2021-01-09 ENCOUNTER — Ambulatory Visit
Admission: RE | Admit: 2021-01-09 | Discharge: 2021-01-09 | Disposition: A | Discharge status: Home / Self Care | Source: Ambulatory Visit | Attending: Neurosurgery | Admitting: Neurosurgery

## 2021-01-09 ENCOUNTER — Other Ambulatory Visit: Payer: Self-pay

## 2021-01-09 DIAGNOSIS — G935 Compression of brain: Secondary | ICD-10-CM | POA: Insufficient documentation

## 2021-01-09 DIAGNOSIS — R202 Paresthesia of skin: Secondary | ICD-10-CM | POA: Diagnosis not present

## 2021-01-09 IMAGING — MR MR [PERSON_NAME] UP W/O CM*R*
7 series · 16 of 16 positions shown · non-contrast
Comparison: None.

CLINICAL DATA: Right-sided neck pain extending into the arm

EXAM:
MRI OF UPPER RIGHT EXTREMITY WITHOUT CONTRAST
TECHNIQUE: Multiplanar, multisequence MR imaging of the right
chest/neck/brachial plexus region was performed. No intravenous
contrast was administered.

[Series 2: T1 · axial · 3.0mm · 1.06mm/px · z∈[-121,+35]mm · 2 of 40 slices shown (1 of 3)]
[im 1/40]
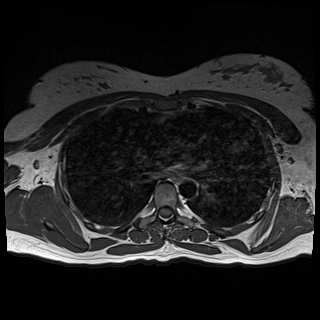
[im 40/40]
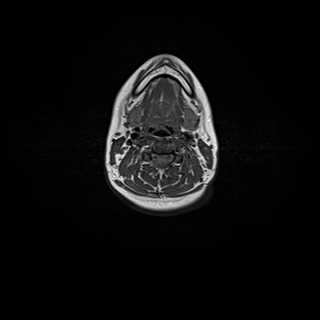

[Series 3: T2 fat-sat · axial · 3.0mm · 1.33mm/px · z∈[-121,+35]mm · 2 of 40 slices shown]
[im 1/40]
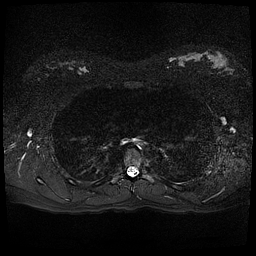
[im 40/40]
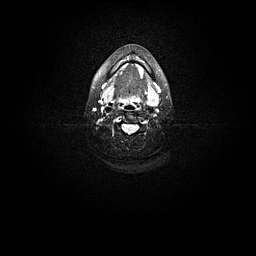

[Series 4: T1 · coronal · 4.0mm · 0.75mm/px · 2 of 36 slices shown (2 of 3)]
[im 1/36]
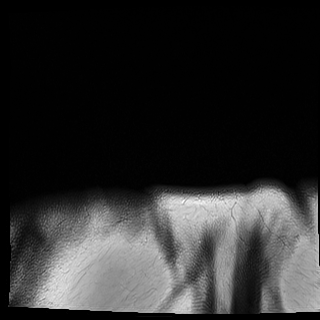
[im 36/36]
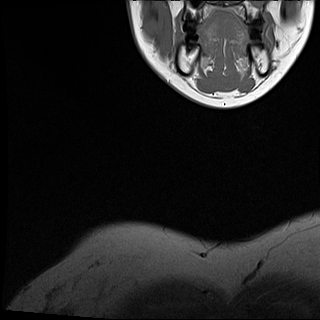

[Series 5: t2_space_cor_cs12_iso · coronal · 0.9mm · 0.94mm/px · 4 of 88 slices shown]
[im 1/88]
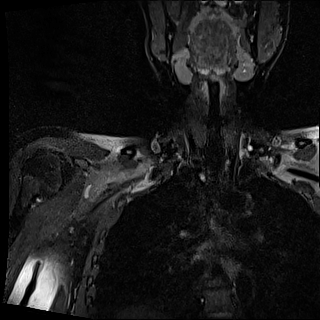
[im 30/88]
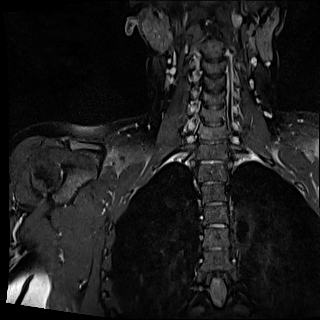
[im 59/88]
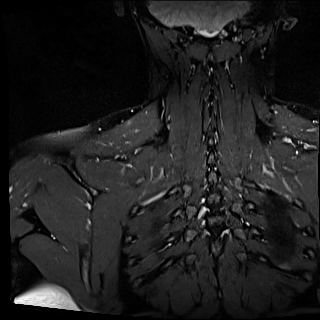
[im 88/88]
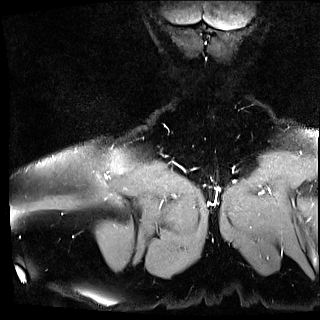

[Series 6: STIR · coronal · 4.0mm · 0.94mm/px · 2 of 38 slices shown (1 of 2)]
[im 1/38]
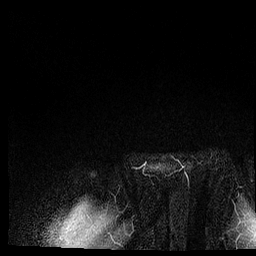
[im 38/38]
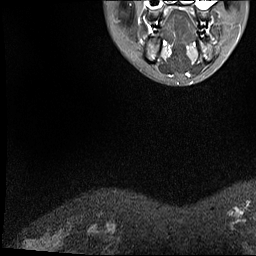

[Series 7: T1 · sagittal · 4.0mm · 0.75mm/px · 2 of 48 slices shown (3 of 3)]
[im 1/48]
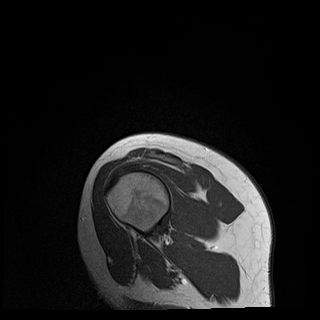
[im 48/48]
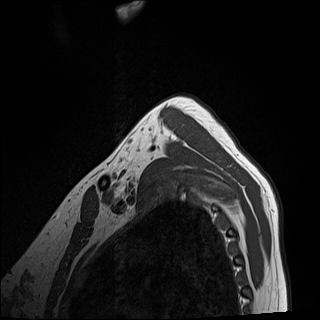

[Series 8: STIR · sagittal · 4.0mm · 0.94mm/px · 2 of 48 slices shown (2 of 2)]
[im 1/48]
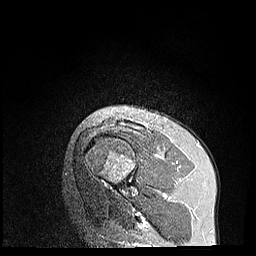
[im 48/48]
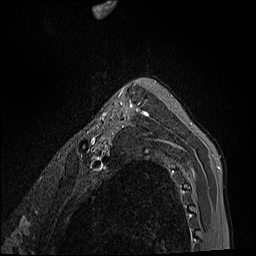

[16 of 16 positions shown; findings below may reference images not displayed]

FINDINGS: Bones/Joint/Cartilage

No acute fracture or suspicious bone marrow signal abnormalities
identified.

Ligaments

No acute abnormality.

Muscles and Tendons

Muscle bulk is preserved. No muscular edema or suspicious mass
lesions. No definite tendinous injury identified.

Soft tissues

Unremarkable. No abnormal soft tissue edema, mass or fluid
collection. Right brachial plexus nerve roots appear unremarkable
without edema.
IMPRESSION: No significant abnormality identified.

## 2021-01-23 ENCOUNTER — Telehealth: Payer: Self-pay | Admitting: Obstetrics and Gynecology

## 2021-01-23 NOTE — Telephone Encounter (Signed)
Pt states that she needs a referral sent in for her to have annual physical to himself, she said this is a loop hole for her [employer], she said if you have any questions to call her.

## 2021-02-12 NOTE — Telephone Encounter (Signed)
Patient called again asking about Dr.Evans placing a referral to himself for her to have her physical completed. Pt states that she needs a referral through [employer].

## 2021-02-23 ENCOUNTER — Telehealth: Payer: Self-pay | Admitting: Obstetrics and Gynecology

## 2021-02-23 NOTE — Telephone Encounter (Signed)
Notified patient that she will need to contact her PCP to have them place the referral to her [employer] insurance for her upcoming appt.

## 2021-03-20 ENCOUNTER — Encounter: Payer: Self-pay | Admitting: Obstetrics and Gynecology

## 2021-03-20 ENCOUNTER — Other Ambulatory Visit (HOSPITAL_COMMUNITY)
Admission: RE | Admit: 2021-03-20 | Discharge: 2021-03-20 | Disposition: A | Discharge status: Home / Self Care | Source: Ambulatory Visit | Attending: Obstetrics and Gynecology | Admitting: Obstetrics and Gynecology

## 2021-03-20 ENCOUNTER — Other Ambulatory Visit: Payer: Self-pay

## 2021-03-20 ENCOUNTER — Ambulatory Visit (INDEPENDENT_AMBULATORY_CARE_PROVIDER_SITE_OTHER): Admitting: Obstetrics and Gynecology

## 2021-03-20 VITALS — BP 135/82 | HR 99 | Ht 63.0 in | Wt 169.7 lb

## 2021-03-20 DIAGNOSIS — Z01419 Encounter for gynecological examination (general) (routine) without abnormal findings: Secondary | ICD-10-CM | POA: Diagnosis not present

## 2021-03-20 DIAGNOSIS — Z124 Encounter for screening for malignant neoplasm of cervix: Secondary | ICD-10-CM | POA: Diagnosis not present

## 2021-03-20 DIAGNOSIS — Z9889 Other specified postprocedural states: Secondary | ICD-10-CM

## 2021-03-20 NOTE — Progress Notes (Signed)
HPI:      [name] is a 31 y.o. [phone] who LMP was No LMP recorded.  Subjective:   She presents today for her annual examination.  She has no complaints.  She is just over 4 months postpartum.  She continues to breast-feed.  She has not yet had a menstrual period. She has been taking labetalol for her blood pressure. She has a history of Budd-Chiari syndrome She has a history of factor V Leiden. Her husband plans a vasectomy for birth control.    Hx: The following portions of the patient's history were reviewed and updated as appropriate:             She  has a past medical history of ADHD, Anxiety, Budd-Chiari syndrome ([place]) (10/11/2020), Factor V Leiden ([place]), History of colposcopy, Trigger finger, and Vaginal Pap smear, abnormal. She does not have any pertinent problems on file. She  has a past surgical history that includes LEEP (N/A). Her family history includes Asthma in her mother; Factor V Leiden deficiency in her paternal grandmother; Healthy in her father. She  reports that she has never smoked. She has never used smokeless tobacco. She reports that she does not drink alcohol and does not use drugs. She has a current medication list which includes the following prescription(s): ibuprofen, labetalol, and prenatal vit-fe fumarate-fa. She is allergic to mushroom extract complex, other, and penicillins.       Review of Systems:  Review of Systems  Constitutional: Denied constitutional symptoms, night sweats, recent illness, fatigue, fever, insomnia and weight loss.  Eyes: Denied eye symptoms, eye pain, photophobia, vision change and visual disturbance.  Ears/Nose/Throat/Neck: Denied ear, nose, throat or neck symptoms, hearing loss, nasal discharge, sinus congestion and sore throat.  Cardiovascular: Denied cardiovascular symptoms, arrhythmia, chest pain/pressure, edema, exercise intolerance, orthopnea and palpitations.  Respiratory: Denied pulmonary symptoms, asthma,  pleuritic pain, productive sputum, cough, dyspnea and wheezing.  Gastrointestinal: Denied, gastro-esophageal reflux, melena, nausea and vomiting.  Genitourinary: Denied genitourinary symptoms including symptomatic vaginal discharge, pelvic relaxation issues, and urinary complaints.  Musculoskeletal: Denied musculoskeletal symptoms, stiffness, swelling, muscle weakness and myalgia.  Dermatologic: Denied dermatology symptoms, rash and scar.  Neurologic: Denied neurology symptoms, dizziness, headache, neck pain and syncope.  Psychiatric: Denied psychiatric symptoms, anxiety and depression.  Endocrine: Denied endocrine symptoms including hot flashes and night sweats.   Meds:   Current Outpatient Medications on File Prior to Visit  Medication Sig Dispense Refill   ibuprofen (ADVIL) 600 MG tablet Take 1 tablet (600 mg total) by mouth every 6 (six) hours. (Patient not taking: Reported on 03/20/2021) 60 tablet 1   labetalol (NORMODYNE) 100 MG tablet Take 1 tablet (100 mg total) by mouth 2 (two) times daily. (Patient not taking: Reported on 03/20/2021) 80 tablet 0   Prenatal Vit-Fe Fumarate-FA (PRENATAL VITAMIN PO) Take by mouth.     No current facility-administered medications on file prior to visit.      Objective:     There were no vitals filed for this visit.  There were no vitals filed for this visit.            Physical examination General NAD, Conversant  HEENT Atraumatic; Op clear with mmm.  Normo-cephalic. Pupils reactive. Anicteric sclerae  Thyroid/Neck Smooth without nodularity or enlargement. Normal ROM.  Neck Supple.  Skin No rashes, lesions or ulceration. Normal palpated skin turgor. No nodularity.  Breasts: No masses or discharge.  Symmetric.  No axillary adenopathy.  Lungs: Clear to auscultation.No rales or  wheezes. Normal Respiratory effort, no retractions.  Heart: NSR.  No murmurs or rubs appreciated. No periferal edema  Abdomen: Soft.  Non-tender.  No masses.  No HSM. No  hernia  Extremities: Moves all appropriately.  Normal ROM for age. No lymphadenopathy.  Neuro: Oriented to PPT.  Normal mood. Normal affect.     Pelvic:   Vulva: Normal appearance.  No lesions.  Vagina: No lesions or abnormalities noted.  Support: Normal pelvic support.  Urethra No masses tenderness or scarring.  Meatus Normal size without lesions or prolapse.  Cervix: Normal appearance.  No lesions.  Anus: Normal exam.  No lesions.  Perineum: Normal exam.  No lesions.        Bimanual   Uterus: Normal size.  Non-tender.  Mobile.  AV.  Adnexae: No masses.  Non-tender to palpation.  Cul-de-sac: Negative for abnormality.     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 Well woman exam with routine gynecological exam   2. Cervical cancer screening   3. History of loop electrical excision procedure (LEEP)        Plan:            1.  Basic Screening Recommendations The basic screening recommendations for asymptomatic women were discussed with the patient during her visit.  The age-appropriate recommendations were discussed with her and the rational for the tests reviewed.  When I am informed by the patient that another primary care physician has previously obtained the age-appropriate tests and they are up-to-date, only outstanding tests are ordered and referrals given as necessary.  Abnormal results of tests will be discussed with her when all of her results are completed.  Routine preventative health maintenance measures emphasized: Exercise/Diet/Weight control, Tobacco Warnings, Alcohol/Substance use risks and Stress Management Pap Co-test performed 2.  Discussed fertility and  breast-feeding-patient well aware-husband plans vasectomy. Orders No orders of the defined types were placed in this encounter.   No orders of the defined types were placed in this encounter.         F/U  Return in about 1 year (around 03/20/2022) for Annual Physical.  Finis Bud, M.D. 03/20/2021 10:41 AM

## 2021-03-20 NOTE — Progress Notes (Signed)
Patients presents for annual exam today. Patient states doing well. She states she is breastfeeding and her period has not returned. Pap smear ordered. Patient states no other questions or concerns at this time.

## 2021-03-21 LAB — CYTOLOGY - PAP
Comment: NEGATIVE
Diagnosis: NEGATIVE
High risk HPV: NEGATIVE

## 2022-03-21 ENCOUNTER — Encounter: Payer: Self-pay | Admitting: Obstetrics and Gynecology

## 2022-04-23 ENCOUNTER — Ambulatory Visit (INDEPENDENT_AMBULATORY_CARE_PROVIDER_SITE_OTHER): Admitting: Obstetrics and Gynecology

## 2022-04-23 ENCOUNTER — Encounter: Payer: Self-pay | Admitting: Obstetrics and Gynecology

## 2022-04-23 VITALS — BP 115/78 | HR 80 | Ht 63.0 in | Wt 173.5 lb

## 2022-04-23 DIAGNOSIS — Z01419 Encounter for gynecological examination (general) (routine) without abnormal findings: Secondary | ICD-10-CM

## 2022-04-23 NOTE — Progress Notes (Signed)
Patients presents for annual exam today. She states regular monthly cycles, currently using condoms until husband has a vasectomy in May. She is up to date on pap smear. Patient states no other questions or concerns at this time.

## 2022-04-23 NOTE — Progress Notes (Signed)
HPI:      [name] is a 32 y.o. [phone] who LMP was Patient's last menstrual period was 03/28/2022.  Subjective:   She presents today for her annual examination.  She has no complaints.  She continues to breast-feed at night.  Her husband has scheduled a vasectomy for birth control.  Currently using condoms without issue.  She like to continue that.  She has a history of previous LEEP and her last 3 Paps have been normal.  She is having normal regular menses.    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 She does not have any pertinent problems on file. She  has a past surgical history that includes LEEP (N/A). Her family history includes Asthma in her mother; Factor V Leiden deficiency in her paternal grandmother; Healthy in her father. She  reports that she has never smoked. She has never used smokeless tobacco. She reports that she does not currently use alcohol. She reports that she does not use drugs. She has a current medication list which includes the following prescription(s): multivitamin. She is allergic to mushroom extract complex, other, and penicillins.       Review of Systems:  Review of Systems  Constitutional: Denied constitutional symptoms, night sweats, recent illness, fatigue, fever, insomnia and weight loss.  Eyes: Denied eye symptoms, eye pain, photophobia, vision change and visual disturbance.  Ears/Nose/Throat/Neck: Denied ear, nose, throat or neck symptoms, hearing loss, nasal discharge, sinus congestion and sore throat.  Cardiovascular: Denied cardiovascular symptoms, arrhythmia, chest pain/pressure, edema, exercise intolerance, orthopnea and palpitations.  Respiratory: Denied pulmonary symptoms, asthma, pleuritic pain,  productive sputum, cough, dyspnea and wheezing.  Gastrointestinal: Denied, gastro-esophageal reflux, melena, nausea and vomiting.  Genitourinary: Denied genitourinary symptoms including symptomatic vaginal discharge, pelvic relaxation issues, and urinary complaints.  Musculoskeletal: Denied musculoskeletal symptoms, stiffness, swelling, muscle weakness and myalgia.  Dermatologic: Denied dermatology symptoms, rash and scar.  Neurologic: Denied neurology symptoms, dizziness, headache, neck pain and syncope.  Psychiatric: Denied psychiatric symptoms, anxiety and depression.  Endocrine: Denied endocrine symptoms including hot flashes and night sweats.   Meds:   Current Outpatient Medications on File Prior to Visit  Medication Sig Dispense Refill   Multiple Vitamin (MULTIVITAMIN) capsule Take 1 capsule by mouth daily.     No current facility-administered medications on file prior to visit.     Objective:     Vitals:   04/23/22 0818  BP: 115/78  Pulse: 80    Filed Weights   04/23/22 0818  Weight: 173 lb 8 oz (78.7 kg)              Patient declined breast exam because of her breast-feeding and pelvic exam because she does not need a Pap.  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 Well woman exam with routine gynecological exam        Plan:            1.  Continue condoms until  vasectomy has been completed  2.  Basic Screening Recommendations The basic screening recommendations for asymptomatic women were discussed with the patient during her visit.  The age-appropriate recommendations were discussed with her and the rational for the tests reviewed.  When I am informed by the  patient that another primary care physician has previously obtained the age-appropriate tests and they are up-to-date, only outstanding tests are ordered and referrals given as necessary.  Abnormal results of tests will be discussed with her when all of her results are completed.  Routine preventative health maintenance measures emphasized: Exercise/Diet/Weight control, Tobacco Warnings, Alcohol/Substance use risks and Stress Management Recommend Pap in 2 years Orders No orders of the defined types were placed in this encounter.   No orders of the defined types were placed in this encounter.         F/U  No follow-ups on file.  Finis Bud, M.D. 04/23/2022 8:40 AM

## 2022-11-13 ENCOUNTER — Ambulatory Visit
Admission: EM | Admit: 2022-11-13 | Discharge: 2022-11-13 | Disposition: A | Discharge status: Home / Self Care | Attending: Family Medicine | Admitting: Family Medicine

## 2022-11-13 ENCOUNTER — Encounter: Payer: Self-pay | Admitting: Emergency Medicine

## 2022-11-13 DIAGNOSIS — H66002 Acute suppurative otitis media without spontaneous rupture of ear drum, left ear: Secondary | ICD-10-CM | POA: Diagnosis not present

## 2022-11-13 MED ORDER — AZITHROMYCIN 250 MG PO TABS: ORAL_TABLET | ORAL | 0 refills | Status: DC

## 2022-11-13 NOTE — ED Triage Notes (Signed)
Pt presents with left ear pain x 2 days  

## 2022-11-13 NOTE — Discharge Instructions (Addendum)
Stop at the pharmacy to pick up your antibiotics.  Take as prescribed.  If you do develop vaginal discharge you can either call here Monday and asked for Diflucan or pick up some over-the-counter miconazole for yeast infection.  Use Tylenol and/or Motrin as needed for pain.  Do not put anything in your ear.

## 2022-11-13 NOTE — ED Provider Notes (Signed)
[facility]    CSN: [id number] Arrival date & time: 11/13/22  1727      History   Chief Complaint Chief Complaint  Patient presents with   Otalgia    HPI Teresa Suarez is a 32 y.o. female.   HPI   Teresa Suarez presents for left ear pain with associated fullness, hearing loss, and pressure.  Had cold symptoms on Thursday.  She tried peroxide and warm compresses without relief. Took some ibuprofen earlier today.  No fever.     Past Medical History:  Diagnosis Date   ADHD    Anxiety    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   Factor V Leiden (HCC)    Hetero-no vte hx expectant management; PP lovenox   History of colposcopy    Trigger finger    Vaginal Pap smear, abnormal     Patient Active Problem List   Diagnosis Date Noted   Indication for care in labor and delivery, antepartum 11/01/2020   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   Heterozygous factor V Leiden affecting pregnancy in third trimester, antepartum (HCC) 08/08/2020   Obesity in pregnancy, antepartum, third trimester 08/08/2020    Past Surgical History:  Procedure Laterality Date   LEEP N/A     OB History     Gravida  3   Para  3   Term  3   Preterm      AB      Living  3      SAB      IAB      Ectopic      Multiple      Live Births  3            Home Medications    Prior to Admission medications   Medication Sig Start Date End Date Taking? Authorizing Provider  azithromycin (ZITHROMAX Z-PAK) 250 MG tablet Take 2 tablets on day 1 then 1 tablet daily 11/13/22  Yes Hatice Bubel, DO  methylphenidate 27 MG PO CR tablet Take 27 mg by mouth every morning. 10/18/22  Yes [provider]  Multiple Vitamin (MULTIVITAMIN) capsule Take 1 capsule by mouth daily.    [provider]    Family History Family History  Problem Relation Age of Onset   Asthma Mother    Healthy Father    Factor V Leiden deficiency Paternal Grandmother     Social  History Social History   Tobacco Use   Smoking status: Never   Smokeless tobacco: Never  Vaping Use   Vaping status: Never Used  Substance Use Topics   Alcohol use: Not Currently    Comment: once monthly   Drug use: Never     Allergies   Mushroom extract complex, Other, and Penicillins   Review of Systems Review of Systems: :negative unless otherwise stated in HPI.      Physical Exam Triage Vital Signs ED Triage Vitals  Encounter Vitals Group     BP 11/13/22 1844 (!) 135/90     Systolic BP Percentile --      Diastolic BP Percentile --      Pulse Rate 11/13/22 1844 93     Resp 11/13/22 1844 18     Temp 11/13/22 1845 99.4 F (37.4 C)     Temp Source 11/13/22 1845 Oral     SpO2 11/13/22 1844 100 %     Weight --      Height --      Head Circumference --  Peak Flow --      Pain Score 11/13/22 1844 8     Pain Loc --      Pain Education --      Exclude from Growth Chart --    No data found.  Updated Vital Signs BP (!) 135/90 (BP Location: Left Arm)   Pulse 93   Temp 99.4 F (37.4 C) (Oral)   Resp 18   LMP 11/07/2022   SpO2 100%   Visual Acuity Right Eye Distance:   Left Eye Distance:   Bilateral Distance:    Right Eye Near:   Left Eye Near:    Bilateral Near:     Physical Exam GEN:     alert, non-ill appearing female in no distress    HENT:  mucus membranes moist, oropharyngeal without lesions or exudate, no tonsils visible, no exudates,  no erythema , no nasal discharge, right TM normal, left TM erythematous, bulging and opaque, TM intact, normal external auditory canals bilaterally, nontender tragus EYES:   no scleral injection NECK:  normal ROM, no meningismus   RESP:  no increased work of breathing Skin:   warm and dry, no rash on visible skin    UC Treatments / Results  Labs (all labs ordered are listed, but only abnormal results are displayed) Labs Reviewed - No data to display  EKG   Radiology No results  found.  Procedures Procedures (including critical care time)  Medications Ordered in UC Medications - No data to display  Initial Impression / Assessment and Plan / UC Course  I have reviewed the triage vital signs and the nursing notes.  Pertinent labs & imaging results that were available during my care of the patient were reviewed by me and considered in my medical decision making (see chart for details).        Acute Otitis media Overall patient is well-appearing, well-hydrated and without respiratory distress. [place] is afebrile.  She has evidence of acute otitis media on the left.  Treat with azithromycin for 5 days.  Tylenol/Motrin's as needed for fever or discomfort.  Stressed importance of hydration. Patient may call for Diflucan or use OTC vaginal yeast infection medication, if needed.   Discussed MDM, treatment plan and plan for follow-up with patient who agrees with plan.   Final Clinical Impressions(s) / UC Diagnoses   Final diagnoses:  Non-recurrent acute suppurative otitis media of left ear without spontaneous rupture of tympanic membrane     Discharge Instructions      Stop at the pharmacy to pick up your antibiotics.  Take as prescribed.  If you do develop vaginal discharge you can either call here Monday and asked for Diflucan or pick up some over-the-counter miconazole for yeast infection.  Use Tylenol and/or Motrin as needed for pain.  Do not put anything in your ear.     ED Prescriptions     Medication Sig Dispense Auth. Provider   azithromycin (ZITHROMAX Z-PAK) 250 MG tablet Take 2 tablets on day 1 then 1 tablet daily 6 tablet Mykael Trott, DO      PDMP not reviewed this encounter.   Katha Cabal, DO 11/13/22 1946

## 2023-06-19 ENCOUNTER — Ambulatory Visit
Admission: EM | Admit: 2023-06-19 | Discharge: 2023-06-19 | Disposition: A | Discharge status: Home / Self Care | Attending: Emergency Medicine | Admitting: Emergency Medicine

## 2023-06-19 DIAGNOSIS — R319 Hematuria, unspecified: Secondary | ICD-10-CM | POA: Diagnosis not present

## 2023-06-19 DIAGNOSIS — N390 Urinary tract infection, site not specified: Secondary | ICD-10-CM

## 2023-06-19 DIAGNOSIS — B3741 Candidal cystitis and urethritis: Secondary | ICD-10-CM

## 2023-06-19 LAB — URINALYSIS, W/ REFLEX TO CULTURE (INFECTION SUSPECTED)
Bilirubin Urine: NEGATIVE
Glucose, UA: NEGATIVE mg/dL
Ketones, ur: NEGATIVE mg/dL
Nitrite: NEGATIVE
Specific Gravity, Urine: 1.015 (ref 1.005–1.030)
WBC, UA: >50 WBC/hpf (ref 0–5)
pH: 7 (ref 5.0–8.0)

## 2023-06-19 MED ORDER — FLUCONAZOLE 150 MG PO TABS: ORAL_TABLET | ORAL | 0 refills | Status: DC

## 2023-06-19 MED ORDER — NITROFURANTOIN MONOHYD MACRO 100 MG PO CAPS: 100.0000 mg | ORAL_CAPSULE | Freq: Two times a day (BID) | ORAL | 0 refills | Status: DC

## 2023-06-19 NOTE — ED Triage Notes (Signed)
 Pt c/o urinary freq,burning & odor x3 days. Denies any hematuria.

## 2023-06-19 NOTE — ED Provider Notes (Signed)
 [facility]    CSN: [id number] Arrival date & time: 06/19/23  0804      History   Chief Complaint Chief Complaint  Patient presents with   Dysuria    HPI Teresa Suarez is a 33 y.o. female.   33 year old female, Teresa Suarez, presents to urgent care for evaluation of UTI symptoms x 3 days(burning, dysuria, malodorous urine, frequency). Has had 1 distant UTI in past, no abx in last month  Patient's past medical history of: ADHD, Budd-Chiari syndrome, anxiety, factor V Leiden, trigger finger  LMP was 06/11/23  The history is provided by the patient. No language interpreter was used.    Past Medical History:  Diagnosis Date   ADHD    Anxiety    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   Factor V Leiden (HCC)    Hetero-no vte hx expectant management; PP lovenox   History of colposcopy    Trigger finger    Vaginal Pap smear, abnormal     Patient Active Problem List   Diagnosis Date Noted   Urinary tract infection with hematuria 06/19/2023   Yeast cystitis 06/19/2023   Indication for care in labor and delivery, antepartum 11/01/2020   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   Heterozygous factor V Leiden affecting pregnancy in third trimester, antepartum (HCC) 08/08/2020   Obesity in pregnancy, antepartum, third trimester 08/08/2020    Past Surgical History:  Procedure Laterality Date   LEEP N/A     OB History     Gravida  3   Para  3   Term  3   Preterm      AB      Living  3      SAB      IAB      Ectopic      Multiple      Live Births  3            Home Medications    Prior to Admission medications   Medication Sig Start Date End Date Taking? Authorizing Provider  amphetamine-dextroamphetamine (ADDERALL XR) 30 MG 24 hr capsule Take 1 capsule by mouth every morning. 05/21/23  Yes [provider]  amphetamine-dextroamphetamine (ADDERALL) 15 MG tablet Take 1 tablet by mouth daily. 05/14/23  Yes [provider]   methylphenidate 27 MG PO CR tablet Take 27 mg by mouth every morning. 10/18/22  Yes [provider]  Multiple Vitamin (MULTIVITAMIN) capsule Take 1 capsule by mouth daily.   Yes [provider]  paragard intrauterine copper IUD IUD 1 each by IntraUterine route continuous 03/18/18 03/16/28 Yes [provider]  azithromycin  (ZITHROMAX  Z-PAK) 250 MG tablet Take 2 tablets on day 1 then 1 tablet daily 11/13/22   Brimage, Vondra, DO  fluconazole (DIFLUCAN) 150 MG tablet Take 1 tab po today, repeat days 3 and 7 06/19/23   Camdin Hegner, Eveleen Hinds, NP  nitrofurantoin, macrocrystal-monohydrate, (MACROBID) 100 MG capsule Take 1 capsule (100 mg total) by mouth 2 (two) times daily. 06/19/23   Fields Oros, Eveleen Hinds, NP    Family History Family History  Problem Relation Age of Onset   Asthma Mother    Healthy Father    Factor V Leiden deficiency Paternal Grandmother     Social History Social History   Tobacco Use   Smoking status: Never   Smokeless tobacco: Never  Vaping Use   Vaping status: Never Used  Substance Use Topics   Alcohol use: Not Currently    Comment: once monthly   Drug  use: Never     Allergies   Penicillins, Mushroom extract complex (obsolete), and Other   Review of Systems Review of Systems  Constitutional:  Negative for fever.  Genitourinary:  Positive for dysuria, frequency and urgency.  All other systems reviewed and are negative.    Physical Exam Triage Vital Signs ED Triage Vitals  Encounter Vitals Group     BP      Systolic BP Percentile      Diastolic BP Percentile      Pulse      Resp      Temp      Temp src      SpO2      Weight      Height      Head Circumference      Peak Flow      Pain Score      Pain Loc      Pain Education      Exclude from Growth Chart    No data found.  Updated Vital Signs BP 127/85 (BP Location: Right Arm)   Pulse 95   Temp 97.6 F (36.4 C) (Oral)   Resp 16   Ht 5\' 3"  (1.6 m)   Wt 158 lb (71.7  kg)   LMP 06/11/2023 (Approximate)   SpO2 98%   BMI 27.99 kg/m   Visual Acuity Right Eye Distance:   Left Eye Distance:   Bilateral Distance:    Right Eye Near:   Left Eye Near:    Bilateral Near:     Physical Exam Vitals and nursing note reviewed.  Constitutional:      General: She is not in acute distress.    Appearance: Normal appearance. She is well-developed and well-groomed.  HENT:     Head: Normocephalic.  Eyes:     Pupils: Pupils are equal, round, and reactive to light.  Cardiovascular:     Rate and Rhythm: Normal rate and regular rhythm.     Heart sounds: Normal heart sounds.  Pulmonary:     Effort: Pulmonary effort is normal.     Breath sounds: Normal breath sounds.  Abdominal:     General: Bowel sounds are normal.     Tenderness: There is abdominal tenderness in the suprapubic area. There is no guarding.  Musculoskeletal:        General: Normal range of motion.     Cervical back: Normal range of motion.  Skin:    General: Skin is warm and dry.  Neurological:     General: No focal deficit present.     Mental Status: She is alert and oriented to person, place, and time.     GCS: GCS eye subscore is 4. GCS verbal subscore is 5. GCS motor subscore is 6.  Psychiatric:        Speech: Speech normal.        Behavior: Behavior normal. Behavior is cooperative.      UC Treatments / Results  Labs (all labs ordered are listed, but only abnormal results are displayed) Labs Reviewed  URINALYSIS, W/ REFLEX TO CULTURE (INFECTION SUSPECTED) - Abnormal; Notable for the following components:      Result Value   APPearance HAZY (*)    Hgb urine dipstick MODERATE (*)    Protein, ur TRACE (*)    Leukocytes,Ua MODERATE (*)    Bacteria, UA FEW (*)    All other components within normal limits  URINE CULTURE    EKG   Radiology No results  found.  Procedures Procedures (including critical care time)  Medications Ordered in UC Medications - No data to  display  Initial Impression / Assessment and Plan / UC Course  I have reviewed the triage vital signs and the nursing notes.  Pertinent labs & imaging results that were available during my care of the patient were reviewed by me and considered in my medical decision making (see chart for details).  Clinical Course as of 06/19/23 2205  Thu Jun 19, 2023  0904 LMP 06/11/23, ordered UA for dysuria, frequency [JD]  0908 UA is positive for : leukocytes, WBC over 50, budding yeast, hgb, will treat for acute uti w hematuria and yeast with macrobid and diflucan, culture pending.  [JD]    Clinical Course User Index [JD] Charlita Brian, Eveleen Hinds, NP   Discussed exam findings and plan of care with patient, strict go to ER precautions given.   Patient verbalized understanding to this provider.  Ddx: Acute uti w hematuria, yeast cystitis, pyelonephritis, kidney stones Final Clinical Impressions(s) / UC Diagnoses   Final diagnoses:  Urinary tract infection with hematuria, site unspecified  Yeast cystitis     Discharge Instructions      You urine is positive for UTI. Take antibiotic as directed(macrobid), take diflucan as directed for yeast. Drink plenty of water, follow up with PCP.   Go to Er for new or worsening issues or concerns(fever, nausea, vomiting, unable to keep meds down, muscle aches, etc)   ED Prescriptions     Medication Sig Dispense Auth. Provider   nitrofurantoin, macrocrystal-monohydrate, (MACROBID) 100 MG capsule  (Status: Discontinued) Take 1 capsule (100 mg total) by mouth 2 (two) times daily. 10 capsule Agata Lucente, NP   fluconazole (DIFLUCAN) 150 MG tablet  (Status: Discontinued) Take 1 tab po today, repeat days 3 and 7 3 tablet Kelsye Loomer, NP   fluconazole (DIFLUCAN) 150 MG tablet Take 1 tab po today, repeat days 3 and 7 3 tablet Lonia Roane, NP   nitrofurantoin, macrocrystal-monohydrate, (MACROBID) 100 MG capsule Take 1 capsule (100 mg total) by mouth  2 (two) times daily. 10 capsule Masami Plata, NP      PDMP not reviewed this encounter.   Peter Brands, NP 06/19/23 2205

## 2023-06-19 NOTE — Discharge Instructions (Addendum)
 You urine is positive for UTI. Take antibiotic as directed(macrobid), take diflucan as directed for yeast. Drink plenty of water, follow up with PCP.   Go to Er for new or worsening issues or concerns(fever, nausea, vomiting, unable to keep meds down, muscle aches, etc)

## 2023-06-20 LAB — URINE CULTURE

## 2023-06-23 ENCOUNTER — Ambulatory Visit (HOSPITAL_COMMUNITY): Payer: Self-pay

## 2023-10-18 ENCOUNTER — Ambulatory Visit (INDEPENDENT_AMBULATORY_CARE_PROVIDER_SITE_OTHER)

## 2023-10-18 ENCOUNTER — Encounter: Payer: Self-pay | Admitting: Emergency Medicine

## 2023-10-18 ENCOUNTER — Ambulatory Visit
Admission: EM | Admit: 2023-10-18 | Discharge: 2023-10-18 | Disposition: A | Discharge status: Home / Self Care | Attending: Physician Assistant | Admitting: Physician Assistant

## 2023-10-18 DIAGNOSIS — G935 Compression of brain: Secondary | ICD-10-CM

## 2023-10-18 DIAGNOSIS — M25511 Pain in right shoulder: Secondary | ICD-10-CM | POA: Diagnosis not present

## 2023-10-18 DIAGNOSIS — M5412 Radiculopathy, cervical region: Secondary | ICD-10-CM | POA: Diagnosis not present

## 2023-10-18 DIAGNOSIS — G8929 Other chronic pain: Secondary | ICD-10-CM

## 2023-10-18 MED ORDER — GABAPENTIN 300 MG PO CAPS: ORAL_CAPSULE | ORAL | 0 refills | Status: DC

## 2023-10-18 MED ORDER — PREDNISONE 10 MG PO TABS: ORAL_TABLET | ORAL | 0 refills | Status: DC

## 2023-10-18 NOTE — ED Triage Notes (Signed)
 Patient c/o chronic right shoulder pain that radiates down her right arm for the past 5 years.  Patient states that she has been doing PT for her right shoulder pain.  Patient states that on Sunday her pain got worse and had numbness and tingling in her right hand.  Patient states that she is in process of getting a new PT set up.  Patient states that her referral for PT expires this month and her new PCP appointment on 11/15/23.

## 2023-10-18 NOTE — ED Provider Notes (Signed)
 [facility]    CSN: [id number] Arrival date & time: 10/18/23  1052      History   Chief Complaint Chief Complaint  Patient presents with   Shoulder Pain    right    HPI Teresa Suarez is a 33 y.o. female with history of Budd-Chiari syndrome, anxiety, ADHD, chronic intractable headaches, chronic neck pain.  Patient presents today for right upper back and posterior right shoulder pain.  She reports having pain in the neck, back and shoulder for the past 5 years.  Symptoms have recently worsened.  She reports pain in the entire arm and says over the past 1 week she has had numbness in her hand.  She states she has had the symptoms before but she was able to get them to resolve with avoiding repetitive movements.  States no matter what she does the numbness does not go away.  Reports reduced grip strength in the right hand compared to the left.  States she has been going to PT for this.  Has been in PT for the past couple months.  Performs a lot of nerve glides and says that has not helped with the numbness.  Patient has been taking ibuprofen  and Tylenol  at high doses and says it has not helped.  She did have an MRI of her neck a couple years ago which showed a few bulging disks.  She has not seen orthopedics.  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 Authorizing Provider  amphetamine-dextroamphetamine (ADDERALL XR) 30 MG 24 hr capsule Take 1 capsule by mouth every morning. 05/21/23  Yes [provider]  amphetamine-dextroamphetamine (ADDERALL) 15 MG tablet Take 1 tablet by mouth daily. 05/14/23  Yes [provider]  gabapentin (NEURONTIN) 300 MG capsule Take 1 cap at bed time x 3 days, then may increase to BID 10/18/23  Yes Arvis Huxley B, PA-C  predniSONE (DELTASONE) 10 MG tablet Take 6 tabs po on day 1 and decrease by 1 tab daily until complete 10/18/23  Yes Arvis Huxley NOVAK, PA-C  methylphenidate 27 MG PO CR tablet Take 27 mg by mouth every morning. 10/18/22   [provider]  Multiple Vitamin (MULTIVITAMIN) capsule Take 1 capsule by mouth daily.    [provider]  paragard intrauterine copper IUD IUD 1 each by IntraUterine route continuous 03/18/18 03/16/28  [provider]    Family History Family History  Problem Relation Age of Onset   Asthma Mother    Healthy Father    Factor V Leiden deficiency Paternal Grandmother     Social History Social History   Tobacco Use   Smoking status: Never   Smokeless tobacco: Never  Vaping Use   Vaping status: Never Used  Substance Use Topics   Alcohol use: Not Currently    Comment: once monthly   Drug use: Never     Allergies   Penicillins, Mushroom extract complex (obsolete), and Other   Review of Systems Review of Systems  Musculoskeletal:  Positive for arthralgias, back pain, neck pain and neck stiffness. Negative for gait problem.  Neurological:  Positive for weakness and numbness.     Physical  Exam Triage Vital Signs ED Triage Vitals  Encounter Vitals Group     BP 10/18/23 1116 131/84     Girls Systolic BP Percentile --      Girls Diastolic BP Percentile --      Boys Systolic BP Percentile --      Boys Diastolic BP Percentile --      Pulse Rate 10/18/23 1116 95     Resp 10/18/23 1116 14     Temp 10/18/23 1116 97.8 F (36.6 C)     Temp Source 10/18/23 1116 Oral     SpO2 10/18/23 1116 100 %     Weight 10/18/23 1113 158 lb 1.1 oz (71.7 kg)     Height 10/18/23 1113 5' 3 (1.6 m)     Head Circumference --      Peak Flow --      Pain Score 10/18/23 1111 6     Pain Loc --      Pain Education --      Exclude from Growth Chart --    No data found.  Updated Vital Signs BP 131/84 (BP Location: Left Arm)   Pulse 95   Temp 97.8 F (36.6 C) (Oral)   Resp 14   Ht 5' 3 (1.6 m)   Wt 158 lb 1.1 oz (71.7 kg)   LMP 09/27/2023 (Approximate)   SpO2 100%   Breastfeeding No   BMI 28.00 kg/m      Physical Exam Vitals and nursing note reviewed.  Constitutional:      General: She is not in acute distress.    Appearance: Normal appearance. She is not ill-appearing or toxic-appearing.  HENT:     Head: Normocephalic and atraumatic.  Eyes:     General: No scleral icterus.       Right eye: No discharge.        Left eye: No discharge.     Conjunctiva/sclera: Conjunctivae normal.  Cardiovascular:     Rate and Rhythm: Normal rate and regular rhythm.     Heart sounds: Normal heart sounds.  Pulmonary:     Effort: Pulmonary effort is normal. No respiratory distress.     Breath sounds: Normal breath sounds.  Musculoskeletal:     Cervical back: Neck supple.     Comments: There is tenderness of right upper back-generalized. Full ROM of neck and shoulders. Mildly reduced grip strength on right compared to left .  Skin:    General: Skin is dry.  Neurological:  General: No focal deficit present.     Mental Status: She is alert. Mental status is at baseline.     Motor: No  weakness.     Gait: Gait normal.  Psychiatric:        Mood and Affect: Mood normal.        Behavior: Behavior normal.      UC Treatments / Results  Labs (all labs ordered are listed, but only abnormal results are displayed) Labs Reviewed - No data to display  EKG   Radiology DG Shoulder Right Result Date: 10/18/2023 CLINICAL DATA:  Chronic right shoulder pain for 5 years EXAM: RIGHT SHOULDER - 2+ VIEW COMPARISON:  None Available. FINDINGS: Frontal, transscapular, and axillary views of the right shoulder are obtained. No fracture, subluxation, or dislocation. Joint spaces are well preserved. Soft tissues are unremarkable. The right chest is clear. IMPRESSION: 1. Unremarkable right shoulder. Electronically Signed   By: Ozell Daring M.D.   On: 10/18/2023 12:00   Narrative & Impression  CLINICAL DATA:  Initial evaluation for right-sided neck pain and tightness extending into the right upper extremity.   EXAM: MRI CERVICAL SPINE WITHOUT CONTRAST   TECHNIQUE: Multiplanar, multisequence MR imaging of the cervical spine was performed. No intravenous contrast was administered.   COMPARISON:  None.   FINDINGS: Alignment: Straightening of the normal cervical lordosis. No listhesis.   Vertebrae: Vertebral body height maintained without acute or chronic fracture. Bone marrow signal intensity within normal limits. Small benign hemangioma noted within the T2 vertebral body. No worrisome osseous lesions. No abnormal marrow edema.   Cord: Normal signal and morphology.   Posterior Fossa, vertebral arteries, paraspinal tissues: Mild Chiari 1 malformation with the cerebellar tonsils extending up to 6 mm below the foramen magnum. Visualized brain and posterior fossa otherwise unremarkable. Paraspinous and prevertebral soft tissues normal. Normal flow voids seen within the vertebral arteries bilaterally.   Disc levels:   C2-C3: Unremarkable.   C3-C4:  Unremarkable.   C4-C5:   Unremarkable.   C5-C6: Minimal annular disc bulge. No spinal stenosis. Foramina remain patent.   C6-C7: Mild annular disc bulge. No spinal stenosis. Foramina remain patent.   C7-T1:  Unremarkable.   Visualized upper thoracic spine demonstrates no significant finding.   IMPRESSION: 1. Minimal annular disc bulging at C5-6 and C6-7 without stenosis or neural impingement. 2. Mild Chiari 1 malformation with the cerebellar tonsils extending up to 6 mm below the foramen magnum. 3. Otherwise unremarkable MRI of the cervical spine.     Electronically Signed   By: Morene Hoard M.D.   On: 09/29/2020 05:09     Procedures Procedures (including critical care time)  Medications Ordered in UC Medications - No data to display  Initial Impression / Assessment and Plan / UC Course  I have reviewed the triage vital signs and the nursing notes.  Pertinent labs & imaging results that were available during my care of the patient were reviewed by me and considered in my medical decision making (see chart for details).   33 year old female with history of Chiari malformation, chronic neck pain with right-sided radiculopathy presents for acute worsening of pain over the past 1 week with associated numbness in right hand.  She has been going to PT for the past couple months.  Has also been taking ibuprofen  and Tylenol .  She is not established with an orthopedic provider.  X-ray of right shoulder negative.  Reviewed MRI of neck obtained in 2022.  Patient reported having symptoms similar to  current symptoms at that time.  Imaging shows mild Chiari I malformation and minimal disc bulging at C5-C6 and C6-C7.  Presentation consistent with cervical radiculopathy.  Sent prednisone taper to pharmacy.  Also will start patient on gabapentin to see if that helps her neuropathic pain.  Advised her to follow-up with PCP and continue with physical therapy.  I placed a referral to orthopedics.  However, if  this referral does not go through she may need to obtain one through her primary care provider.  Reviewed ED precautions with her.   Final Clinical Impressions(s) / UC Diagnoses   Final diagnoses:  Acute pain of right shoulder  Cervical radiculopathy  Chiari malformation type I [facility])     Discharge Instructions      NECK PAIN: Stressed avoiding painful activities. This can exacerbate your symptoms and make them worse.  May apply heat to the areas of pain for some relief. Use medications as directed. Be aware of which medications make you drowsy and do not drive or operate any kind of heavy machinery while using the medication (ie pain medications or muscle relaxers). F/U with PCP for reexamination or return sooner if condition worsens or does not begin to improve over the next few days.   NECK PAIN RED FLAGS: If symptoms get worse than they are right now, you should come back sooner for re-evaluation. If you have increased numbness/ tingling or notice that the numbness/tingling is affecting the legs or saddle region, go to ER. If you ever lose continence go to ER.         ED Prescriptions     Medication Sig Dispense Auth. Provider   predniSONE (DELTASONE) 10 MG tablet Take 6 tabs po on day 1 and decrease by 1 tab daily until complete 21 tablet Arvis Huxley B, PA-C   gabapentin (NEURONTIN) 300 MG capsule Take 1 cap at bed time x 3 days, then may increase to BID 60 capsule Arvis Huxley NOVAK, PA-C      PDMP not reviewed this encounter.   Arvis Huxley NOVAK, PA-C 10/18/23 1430

## 2023-10-18 NOTE — Discharge Instructions (Addendum)
 NECK PAIN: Stressed avoiding painful activities. This can exacerbate your symptoms and make them worse.  May apply heat to the areas of pain for some relief. Use medications as directed. Be aware of which medications make you drowsy and do not drive or operate any kind of heavy machinery while using the medication (ie pain medications or muscle relaxers). F/U with PCP for reexamination or return sooner if condition worsens or does not begin to improve over the next few days.   NECK PAIN RED FLAGS: If symptoms get worse than they are right now, you should come back sooner for re-evaluation. If you have increased numbness/ tingling or notice that the numbness/tingling is affecting the legs or saddle region, go to ER. If you ever lose continence go to ER.

## 2024-02-12 ENCOUNTER — Other Ambulatory Visit: Payer: Self-pay

## 2024-02-12 ENCOUNTER — Encounter: Payer: Self-pay | Admitting: Psychiatry

## 2024-02-12 ENCOUNTER — Ambulatory Visit (INDEPENDENT_AMBULATORY_CARE_PROVIDER_SITE_OTHER): Payer: Self-pay | Admitting: Psychiatry

## 2024-02-12 VITALS — BP 138/88 | HR 94 | Temp 97.8°F | Ht 63.0 in | Wt 150.4 lb

## 2024-02-12 DIAGNOSIS — F902 Attention-deficit hyperactivity disorder, combined type: Secondary | ICD-10-CM | POA: Insufficient documentation

## 2024-02-12 DIAGNOSIS — Z79899 Other long term (current) drug therapy: Secondary | ICD-10-CM | POA: Insufficient documentation

## 2024-02-12 DIAGNOSIS — F419 Anxiety disorder, unspecified: Secondary | ICD-10-CM | POA: Diagnosis not present

## 2024-02-12 NOTE — Progress Notes (Signed)
 " Psychiatric Initial Adult Assessment   Patient Identification: Teresa Suarez MRN:  [id number] Date of Evaluation:  02/12/2024 Referral Source: Alfonso Grow FNP Chief Complaint:   Chief Complaint  Patient presents with   Establish Care   ADD   Medication Refill   Anxiety   Visit Diagnosis:    ICD-10-CM   1. Attention deficit hyperactivity disorder (ADHD), combined type  F90.2 Urine drugs of abuse scrn w alc, routine (Ref Lab)    2. Anxiety disorder, unspecified type  F41.9 TSH    3. High risk medication use  Z79.899 TSH    Urine drugs of abuse scrn w alc, routine (Ref Lab)      Discussed the use of AI scribe software for clinical note transcription with the patient, who gave verbal consent to proceed.  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  Ongoing symptoms of ADHD, including chronic difficulties with organization, forgetfulness, and maintaining routines, continue to affect her daily life. She describes a longstanding pattern of disorganization, such as not turning in homework during school, struggling to stick to routines, and frequently relying on multiple calendars and reminders to manage daily responsibilities. She often compensates for her symptoms with various organizational strategies. Her ADHD symptoms contributed to her initial presentation for therapy, where she ultimately received her diagnosis. 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  Anxiety, particularly related to punctuality and being late, remains a concern,  which she partially links to her upbringing. She reports that her anxiety is generally well managed and has improved since her ADHD diagnosis, as understanding her symptoms has helped reduce self-blame. Triggers such as being late can cause her heart rate to spike and make her feel on edge. She uses self-talk and cognitive strategies learned in therapy to manage anxiety when it arises. She denies current or past symptoms of depression, mania, psychosis, or obsessive-compulsive behaviors. She also denies trauma outside of emotional challenges and family stressors during childhood.  Her current regimen includes Adderall XR 30 mg in the morning with an additional 15 mg immediate release in the afternoon as needed for ADHD. She does not take the medication on weekends unless needed and typically does not require monthly refills. She denies any significant side effects from her current stimulant regimen, noting that any side effects she experiences tend to be subtle and difficult to notice. She also expresses a strong desire to minimize stimulant use due to her family history of substance use disorder.  She denies any suicidality, homicidality or perceptual disturbances.  Associated Signs/Symptoms: Depression Symptoms:  anxiety, (Hypo) Manic Symptoms:  Denies Anxiety Symptoms:  anxiety situational Psychotic Symptoms:  denies PTSD Symptoms: Negative  Past Psychiatric History: She was previously under the care of [facility].  Thereafter she was under the care of a primary care provider who was managing her medications.  She completed a trial of Concerta for ADHD, starting at 27 mg and titrating over 3 months with increasing doses; she reported minimal benefit and significant fatigue. She transitioned to Adderall after the Concerta trial. She engaged in therapy for anxiety, which led to an  ADHD diagnosis. She has a history of self-harm by cutting at age 69-13, which she describes as impulsive and  not associated with suicidal intent.  She denies any inpatient behavioral health admissions.  Previous Psychotropic Medications: Yes as noted above.  Substance Abuse History in the last 12 months:  No.She denies any history of substance use, including alcohol, tobacco, and recreational or illicit drugs. She reports being rigidly anti-substance use due to witnessing a family member's addiction from a young age.   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  Family History:  Family History  Problem Relation Age of Onset   Asthma Mother    Healthy Father    Drug abuse Brother    Schizophrenia Brother    Factor V Leiden deficiency Paternal Grandmother     Social History:   Social History   Socioeconomic History   Marital status: Married    Spouse name: Not on file   Number of children: 3   Years of education: Not on file   Highest education level: Bachelor's degree (e.g., BA, AB, BS)  Occupational History   Not on file  Tobacco Use   Smoking status: Never   Smokeless tobacco: Never  Vaping Use   Vaping status: Never Used  Substance and Sexual Activity   Alcohol use: Not Currently    Comment: once monthly   Drug use: Never   Sexual activity: Yes    Birth control/protection: Surgical  Other Topics Concern   Not on file  Social History Narrative   Not on file   Social Drivers of Health   Tobacco Use: Low Risk (02/12/2024)   Patient History    Smoking Tobacco Use: Never    Smokeless Tobacco Use: Never    Passive Exposure: Not on file  Financial Resource Strain: Low Risk (02/14/2022)    Received from [facility]   Overall Financial Resource Strain (CARDIA)    Difficulty of Paying Living Expenses: Not hard at all  Food Insecurity: No Food Insecurity (10/07/2023)   Received from [facility]   Epic    Within the past 12 months, you worried that your food would run out before you got the money to buy more.: Never true    Within the past 12 months, the food you bought just didn't last and you didn't have money to get more.: Never true  Transportation Needs: No Transportation Needs (10/07/2023)   Received from [facility]   PRAPARE - Transportation    Lack of Transportation (Medical): No    Lack of Transportation (Non-Medical): No  Physical Activity: Not on file  Stress: Not on file  Social Connections: Not on file  Depression (PHQ2-9): Low Risk (02/12/2024)   Depression (PHQ2-9)    PHQ-2 Score: 3  Alcohol Screen: Not on file  Housing: Not on file  Utilities: Low Risk (10/07/2023)   Received from [facility]   Utilities    Within the past 12 months, have you been unable to get utilities(heat, electricity) when it was  really needed?: No  Health Literacy: Not on file    Additional Social History: She was born and raised in [place] .  She was raised by both parents.  She does report childhood as challenging being the youngest of 5 siblings as well as dealing with family dynamics due to having a brother with substance abuse problems.  She has a bachelor's degree.  She is currently employed as a manufacturing systems engineer. She lives with her spouse, who is active duty hotel manager and transitioning to technical sales engineer, and 3 children (ages 69, 22, and 3) in [place] is active in a [employer] corporation and open to all beliefs.  She denies any legal problems.  She does have access to a gun which is safely locked away.  Allergies:  Allergies[1]  Metabolic Disorder Labs: No results found for: HGBA1C, MPG No results found for: PROLACTIN No results found for: CHOL, TRIG, HDL,  CHOLHDL, VLDL, LDLCALC No results found for: TSH  Therapeutic Level Labs: No results found for: LITHIUM No results found for: CBMZ No results found for: VALPROATE  Current Medications: Current Outpatient Medications  Medication Sig Dispense Refill   amphetamine-dextroamphetamine (ADDERALL XR) 30 MG 24 hr capsule Take 1 capsule by mouth every morning.     amphetamine-dextroamphetamine (ADDERALL) 15 MG tablet Take 1 tablet by mouth daily.     meloxicam (MOBIC) 15 MG tablet Take 15 mg by mouth daily.     Multiple Vitamin (MULTIVITAMIN) capsule Take 1 capsule by mouth daily.     No current facility-administered medications for this visit.    Musculoskeletal: Strength & Muscle Tone: within normal limits Gait & Station: normal Patient leans: N/A  Psychiatric Specialty Exam: Review of Systems  Psychiatric/Behavioral:  The patient is nervous/anxious.     Blood pressure 138/88, pulse 94, temperature 97.8 F (36.6 C), temperature source Temporal, height 5' 3 (1.6 m), weight 150 lb 6.4 oz (68.2 kg).Body mass index is 26.64 kg/m.  General Appearance: Fairly Groomed  Eye Contact:  Fair  Speech:  Clear and Coherent  Volume:  Normal  Mood:  Anxious situational  Affect:  Congruent  Thought Process:  Goal Directed and Descriptions of Associations: Intact  Orientation:  Full (Time, Place, and Person)  Thought Content:  Logical  Suicidal Thoughts:  No  Homicidal Thoughts:  No  Memory:  Immediate;   Fair Recent;   Fair Remote;   Fair  Judgement:  Fair  Insight:  Fair  Psychomotor Activity:  Normal  Concentration:  Concentration: Fair and Attention Span: Fair  Recall:  [employer] of Knowledge:Fair  Language: Fair  Akathisia:  No  Handed:  Right  AIMS (if indicated):  not done  Assets:  Communication Skills Desire for Improvement Housing Social Support Transportation  ADL's:  Intact  Cognition: WNL  Sleep:  Fair   Screenings: GAD-7    Flowsheet Row Office  Visit from [facility]/12/2024 in [facility]  Total GAD-7 Score 3   PHQ2-9    Flowsheet Row Office Visit from 02/12/2024 in [facility]  PHQ-2 Total Score 0  PHQ-9 Total Score 3   Flowsheet Row Office Visit from 02/12/2024 in [facility] UC from 10/18/2023 in [facility]  UC from 06/19/2023 in [facility]   C-SSRS RISK CATEGORY No Risk No Risk No Risk    Assessment and Plan: Breck Hollinger is a 34 year old Caucasian female who presented to establish care, discussed  assessment and plan as noted below. Assessment & Plan Attention-deficit hyperactivity disorder, combined type-improving ADHD diagnosis is confirmed through psychological evaluation.  I have reviewed neuropsychological testing per Dr[facility]/04/[facility].  Concerta was previously ineffective and caused excessive sedation. She currently tolerates Adderall XR 30 mg with 15 mg IR as needed well, without significant side effects. Anxiety symptoms have improved with ADHD management and therapy.  Continue Adderall XR 30 mg daily with 15 mg IR as needed.  A urine drug screen and TSH test are ordered to rule out thyroid dysfunction.  Urine drug screen results will be reviewed before prescribing the next refill.  Anxiety disorder unspecified-improving Anxiety symptoms are managed with cognitive behavioral strategies and therapy, exacerbated by ADHD symptoms and situational stressors like being late. There is no current therapy due to the therapist's change in practice focus, but she is open to telehealth options.  Referred patient for psychotherapy with in-house therapist.  High risk medication use   Will order urine drug screen, TSH.  Patient to go to [facility] lab.  Follow-up Follow-up in clinic in 2 months or sooner if needed.   Collaboration of Care:  Referral or follow-up with counselor/therapist AEB patient referred for therapy.  Patient/Guardian was advised Release of Information must be obtained prior to any record release in order to collaborate their care with an outside provider. Patient/Guardian was advised if they have not already done so to contact the registration department to sign all necessary forms in order for us  to release information regarding their care.   Consent: Patient/Guardian gives verbal consent for treatment and assignment of benefits for services provided during this visit. Patient/Guardian expressed understanding and agreed to proceed.  This note was generated in part or whole with voice recognition software. Voice recognition is usually quite accurate but there are transcription errors that can and very often do occur. I apologize for any typographical errors that were not detected and corrected.    Yosmar Ryker, MD 1/23/20267:28 AM     [1]  Allergies Allergen Reactions   Penicillins Rash, Dermatitis and Itching    Rash as a child, itching as child   Methylphenidate Other (See Comments)    fatigue   Mushroom Extract Complex (Obsolete) Itching and Rash   Other Itching and Rash   "

## 2024-02-13 ENCOUNTER — Other Ambulatory Visit
Admission: RE | Admit: 2024-02-13 | Discharge: 2024-02-13 | Disposition: A | Attending: Psychiatry | Admitting: Psychiatry

## 2024-02-13 DIAGNOSIS — F419 Anxiety disorder, unspecified: Secondary | ICD-10-CM | POA: Diagnosis present

## 2024-02-13 DIAGNOSIS — Z79899 Other long term (current) drug therapy: Secondary | ICD-10-CM | POA: Diagnosis present

## 2024-02-13 DIAGNOSIS — F902 Attention-deficit hyperactivity disorder, combined type: Secondary | ICD-10-CM | POA: Insufficient documentation

## 2024-02-13 LAB — TSH: TSH: 0.958 u[IU]/mL (ref 0.350–4.500)

## 2024-02-16 ENCOUNTER — Ambulatory Visit: Payer: Self-pay | Admitting: Psychiatry

## 2024-02-19 LAB — URINE DRUGS OF ABUSE SCREEN W ALC, ROUTINE (REF LAB)
Barbiturate, Ur: NEGATIVE ng/mL
Benzodiazepine Quant, Ur: NEGATIVE ng/mL
Cannabinoid Quant, Ur: NEGATIVE ng/mL
Cocaine (Metab.): NEGATIVE ng/mL
Creatinine, Urine: 187.5 mg/dL (ref 20.0–300.0)
Ethanol U, Quan: NEGATIVE %
Methadone Screen, Urine: NEGATIVE ng/mL
Nitrite Urine, Quantitative: NEGATIVE ug/mL
OPIATE SCREEN URINE: NEGATIVE ng/mL
Phencyclidine, Ur: NEGATIVE ng/mL
Propoxyphene, Urine: NEGATIVE ng/mL
pH, Urine: 5.9 (ref 4.5–8.9)

## 2024-02-19 LAB — AMPHETAMINE CONF, UR
Amphetamine GC/MS Conf: >3000 ng/mL
Amphetamine, Ur: POSITIVE — AB
Amphetamines: POSITIVE — AB
Methamphetamine, Ur: NEGATIVE

## 2024-02-20 ENCOUNTER — Telehealth: Payer: Self-pay | Admitting: Psychiatry

## 2024-02-20 ENCOUNTER — Ambulatory Visit: Payer: Self-pay | Admitting: Psychiatry

## 2024-02-20 NOTE — Telephone Encounter (Signed)
 Reviewed most recent urine drug screen dated 02/13/2024-positive for amphetamines as expected otherwise negative.  Attempted to contact patient to discuss urine drug screen as well as to renew Adderall medication refill if needed however had to leave a voicemail.

## 2024-02-23 ENCOUNTER — Telehealth: Payer: Self-pay

## 2024-02-23 DIAGNOSIS — F902 Attention-deficit hyperactivity disorder, combined type: Secondary | ICD-10-CM

## 2024-02-23 MED ORDER — AMPHETAMINE-DEXTROAMPHETAMINE 15 MG PO TABS: 1.0000 | ORAL_TABLET | Freq: Every day | ORAL | 0 refills | Status: AC

## 2024-02-23 MED ORDER — AMPHETAMINE-DEXTROAMPHET ER 30 MG PO CP24: 30.0000 mg | ORAL_CAPSULE | Freq: Every morning | ORAL | 0 refills | Status: AC

## 2024-02-23 NOTE — Addendum Note (Signed)
 Addended byBETHA COBY HEIGHT on: 02/23/2024 04:56 PM   Modules accepted: Orders

## 2024-02-23 NOTE — Telephone Encounter (Signed)
 Patient left voicemail stating that she missed a call returned call to patient to inform the phone call was from the provider to discuss the lab results and refill patient stated that she does need a refill of the amphetamine -dextroamphetamine  (ADDERALL XR) 30 MG 24 hr capsule  amphetamine -dextroamphetamine  (ADDERALL) 15 MG tablet   Last visit 02-12-24 Next visit 04-14-24      Preferred Pharmacies   [id number] [place], [place] - [address] AT [facility] OF [address] [facility] Phone: [phone]  Fax: [phone]

## 2024-02-23 NOTE — Telephone Encounter (Signed)
 I have sent Adderall extended release 30 mg and Adderall immediate release 15 mg to [facility] as requested.

## 2024-02-24 NOTE — Telephone Encounter (Signed)
 Called patient to make aware that medication had been sent to the pharmacy she voiced understanding

## 2024-02-26 ENCOUNTER — Ambulatory Visit (INDEPENDENT_AMBULATORY_CARE_PROVIDER_SITE_OTHER): Payer: Self-pay

## 2024-02-26 DIAGNOSIS — F419 Anxiety disorder, unspecified: Secondary | ICD-10-CM

## 2024-02-26 DIAGNOSIS — F902 Attention-deficit hyperactivity disorder, combined type: Secondary | ICD-10-CM | POA: Diagnosis not present
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
## 2024-03-10 ENCOUNTER — Ambulatory Visit

## 2024-03-18 ENCOUNTER — Ambulatory Visit

## 2024-03-24 ENCOUNTER — Ambulatory Visit

## 2024-03-30 ENCOUNTER — Ambulatory Visit (HOSPITAL_COMMUNITY)

## 2024-03-30 ENCOUNTER — Encounter: Admitting: Vascular Surgery

## 2024-04-14 ENCOUNTER — Telehealth: Payer: Self-pay | Admitting: Psychiatry
